# Patient Record
Sex: Male | Born: 1990 | Hispanic: No | Marital: Single | Smoking: Current every day smoker
Health system: Southern US, Community
[De-identification: ages and names within clinical notes are randomized; demographics above are authoritative.]

## PROBLEM LIST (undated history)

## (undated) DIAGNOSIS — L309 Dermatitis, unspecified: Secondary | ICD-10-CM

## (undated) DIAGNOSIS — F1910 Other psychoactive substance abuse, uncomplicated: Secondary | ICD-10-CM

## (undated) HISTORY — DX: Dermatitis, unspecified: L30.9

## (undated) HISTORY — PX: NO PAST SURGERIES: SHX2092

## (undated) HISTORY — DX: Other psychoactive substance abuse, uncomplicated: F19.10

---

## 2014-05-06 ENCOUNTER — Emergency Department (HOSPITAL_COMMUNITY)
Admission: EM | Admit: 2014-05-06 | Discharge: 2014-05-06 | Disposition: A | Discharge status: Home / Self Care | Payer: Self-pay | Attending: Emergency Medicine | Admitting: Emergency Medicine

## 2014-05-06 ENCOUNTER — Encounter (HOSPITAL_COMMUNITY): Payer: Self-pay | Admitting: Emergency Medicine

## 2014-05-06 ENCOUNTER — Emergency Department (HOSPITAL_COMMUNITY): Payer: Self-pay

## 2014-05-06 DIAGNOSIS — F1410 Cocaine abuse, uncomplicated: Secondary | ICD-10-CM | POA: Insufficient documentation

## 2014-05-06 DIAGNOSIS — F1910 Other psychoactive substance abuse, uncomplicated: Secondary | ICD-10-CM

## 2014-05-06 DIAGNOSIS — R259 Unspecified abnormal involuntary movements: Secondary | ICD-10-CM | POA: Insufficient documentation

## 2014-05-06 DIAGNOSIS — F1210 Cannabis abuse, uncomplicated: Secondary | ICD-10-CM | POA: Insufficient documentation

## 2014-05-06 DIAGNOSIS — Z79899 Other long term (current) drug therapy: Secondary | ICD-10-CM | POA: Insufficient documentation

## 2014-05-06 DIAGNOSIS — R209 Unspecified disturbances of skin sensation: Secondary | ICD-10-CM | POA: Insufficient documentation

## 2014-05-06 DIAGNOSIS — Z792 Long term (current) use of antibiotics: Secondary | ICD-10-CM | POA: Insufficient documentation

## 2014-05-06 DIAGNOSIS — F17200 Nicotine dependence, unspecified, uncomplicated: Secondary | ICD-10-CM | POA: Insufficient documentation

## 2014-05-06 LAB — COMPREHENSIVE METABOLIC PANEL
ALBUMIN: 4.9 g/dL (ref 3.5–5.2)
ALK PHOS: 66 U/L (ref 39–117)
ALT: 28 U/L (ref 0–53)
AST: 30 U/L (ref 0–37)
Anion gap: 17 — ABNORMAL HIGH (ref 5–15)
BILIRUBIN TOTAL: 0.5 mg/dL (ref 0.3–1.2)
BUN: 16 mg/dL (ref 6–23)
CHLORIDE: 97 meq/L (ref 96–112)
CO2: 25 mEq/L (ref 19–32)
CREATININE: 1.04 mg/dL (ref 0.50–1.35)
Calcium: 9.5 mg/dL (ref 8.4–10.5)
GFR calc Af Amer: >90 mL/min (ref >90)
Glucose, Bld: 105 mg/dL — ABNORMAL HIGH (ref 70–99)
POTASSIUM: 3.7 meq/L (ref 3.7–5.3)
Sodium: 139 mEq/L (ref 137–147)
Total Protein: 8.2 g/dL (ref 6.0–8.3)

## 2014-05-06 LAB — CBC WITH DIFFERENTIAL/PLATELET
Basophils Absolute: 0 10*3/uL (ref 0.0–0.1)
Basophils Relative: 0 % (ref 0–1)
Eosinophils Absolute: 0.1 10*3/uL (ref 0.0–0.7)
Eosinophils Relative: 1 % (ref 0–5)
HEMATOCRIT: 54.9 % — AB (ref 39.0–52.0)
Hemoglobin: 19.9 g/dL — ABNORMAL HIGH (ref 13.0–17.0)
LYMPHS PCT: 28 % (ref 12–46)
Lymphs Abs: 2.3 10*3/uL (ref 0.7–4.0)
MCH: 30.9 pg (ref 26.0–34.0)
MCHC: 36.2 g/dL — ABNORMAL HIGH (ref 30.0–36.0)
MCV: 85.1 fL (ref 78.0–100.0)
Monocytes Absolute: 0.6 10*3/uL (ref 0.1–1.0)
Monocytes Relative: 7 % (ref 3–12)
NEUTROS ABS: 5.2 10*3/uL (ref 1.7–7.7)
Neutrophils Relative %: 64 % (ref 43–77)
PLATELETS: 200 10*3/uL (ref 150–400)
RBC: 6.45 MIL/uL — AB (ref 4.22–5.81)
WBC: 8.6 10*3/uL (ref 4.0–10.5)

## 2014-05-06 LAB — ETHANOL: ALCOHOL ETHYL (B): 95 mg/dL — AB (ref 0–11)

## 2014-05-06 LAB — RAPID URINE DRUG SCREEN, HOSP PERFORMED
AMPHETAMINES: NOT DETECTED
BARBITURATES: NOT DETECTED
Benzodiazepines: NOT DETECTED
Cocaine: POSITIVE — AB
Opiates: NOT DETECTED
Tetrahydrocannabinol: POSITIVE — AB

## 2014-05-06 LAB — TROPONIN I

## 2014-05-06 LAB — ACETAMINOPHEN LEVEL

## 2014-05-06 MED ORDER — ASPIRIN 81 MG PO CHEW
324.0000 mg | CHEWABLE_TABLET | Freq: Once | ORAL | Status: AC
  Administered 2014-05-06: 324 mg via ORAL
  Filled 2014-05-06: qty 4

## 2014-05-06 MED ORDER — SODIUM CHLORIDE 0.9 % IV BOLUS (SEPSIS)
1000.0000 mL | Freq: Once | INTRAVENOUS | Status: AC
  Administered 2014-05-06: 1000 mL via INTRAVENOUS

## 2014-05-06 MED ORDER — LORAZEPAM 2 MG/ML IJ SOLN
2.0000 mg | Freq: Once | INTRAMUSCULAR | Status: AC
  Administered 2014-05-06: 2 mg via INTRAVENOUS
  Filled 2014-05-06: qty 1

## 2014-05-06 NOTE — ED Notes (Signed)
Pt states that tonight he had a "couple bumps" of cocaine, 4-5 oxycodones and beer and liquor tonight. Pt states that he came in because he started panicking after mixing all of those substances. Shaking in room. HR 91

## 2014-05-06 NOTE — ED Notes (Signed)
Pt sts the 4-5 oxycodone were snorted and that he is not 100% sure that it was oxycodone, that was just what he was told.

## 2014-05-06 NOTE — ED Notes (Signed)
Poison control notified and recommends that he mainly be observed. PC sts that if he had taken 4-5 oxycodone that he should be more sedated at this point. If pt. Becomes sedated, PC recommends Narcan. PC sts that cocaine could cause HTN, Tachycardia, and the shaking that the pt. Is experiencing.

## 2014-05-06 NOTE — ED Provider Notes (Addendum)
CSN: [id number]     Arrival date & time 05/06/14  0003 History   First MD Initiated Contact with Patient 05/06/14 0030     Chief Complaint  Patient presents with  . Ingestion     (Consider location/radiation/quality/duration/timing/severity/associated sxs/prior Treatment) HPI Comments: Patient is a 23 year old male who presents with alcohol and drug intoxication. Reports drinking 2 "very large [place] drinks", snorting a few "bumps" of cocaine and snorting  4-5 pills which he believes to be oxycodone. Began sweating, shaking, having palpitations and started to panic. Currently experiencing right sided facial numbness and uncontrollable shaking of bilateral legs, right worse than left. Has never had reaction to drugs or alcohol like this before, but has experienced panic attacks.  Typically drinks 4-5 beers daily and uses Adderall, cocaine and marijuana weekly. Snorting, what he thinks was percocet, was new for him. Does not feel he has an alcohol or drinking problem. No known hx of mental illness, denies SI. Denies headache, SOB, chest pain or numbness of extremities.  Patient is a 23 y.o. male presenting with Ingested Medication. The history is provided by the patient.  Ingestion Pertinent negatives include no chest pain, no abdominal pain, no headaches and no shortness of breath.    History reviewed. No pertinent past medical history. History reviewed. No pertinent past surgical history. No family history on file. History  Substance Use Topics  . Smoking status: Current Every Day Smoker -- 1.00 packs/day  . Smokeless tobacco: Not on file  . Alcohol Use: Yes    Review of Systems  Constitutional: Positive for activity change. Negative for appetite change.  Respiratory: Negative for cough and shortness of breath.   Cardiovascular: Negative for chest pain.  Gastrointestinal: Negative for abdominal pain.  Genitourinary: Negative for dysuria.  Neurological: Positive for tremors and  numbness. Negative for headaches.      Allergies  Review of patient's allergies indicates no known allergies.  Home Medications   Prior to Admission medications   Medication Sig Start Date End Date Taking? Authorizing Provider  Amoxicillin (AMOXIL PO) Take 1 capsule by mouth daily.   Yes Historical Provider, MD  amphetamine-dextroamphetamine (ADDERALL) 20 MG tablet Take 40 mg by mouth once.   Yes Historical Provider, MD  ibuprofen (ADVIL,MOTRIN) 200 MG tablet Take 800 mg by mouth every 6 (six) hours as needed for moderate pain.   Yes Historical Provider, MD  OXYCODONE HCL PO Take 4 tablets by mouth once.   Yes Historical Provider, MD   BP 148/76  Pulse 99  Temp(Src) 98.1 F (36.7 C) (Oral)  Resp 18  SpO2 98% Physical Exam  Nursing note and vitals reviewed. Constitutional: He is oriented to person, place, and time. He appears well-developed.  HENT:  Head: Normocephalic and atraumatic.  Eyes: Conjunctivae and EOM are normal. Pupils are equal, round, and reactive to light.  Neck: Normal range of motion. Neck supple.  Cardiovascular: Regular rhythm.   No murmur heard. Pulmonary/Chest: Effort normal and breath sounds normal.  Abdominal: Soft. Bowel sounds are normal. He exhibits no distension. There is no tenderness. There is no rebound and no guarding.  Neurological: He is alert and oriented to person, place, and time. Coordination normal.  Right sided facial numbness. Motor and sensory exam for the upper extremity is normal.  Skin: Skin is warm.    ED Course  Procedures (including critical care time) Labs Review Labs Reviewed  CBC WITH DIFFERENTIAL - Abnormal; Notable for the following:    RBC 6.45 (*)  Hemoglobin 19.9 (*)    HCT 54.9 (*)    MCHC 36.2 (*)    All other components within normal limits  COMPREHENSIVE METABOLIC PANEL - Abnormal; Notable for the following:    Glucose, Bld 105 (*)    Anion gap 17 (*)    All other components within normal limits  URINE  RAPID DRUG SCREEN (HOSP PERFORMED) - Abnormal; Notable for the following:    Cocaine POSITIVE (*)    Tetrahydrocannabinol POSITIVE (*)    All other components within normal limits  ETHANOL - Abnormal; Notable for the following:    Alcohol, Ethyl (B) 95 (*)    All other components within normal limits  TROPONIN I  ACETAMINOPHEN LEVEL    Imaging Review Ct Head Wo Contrast  05/06/2014   CLINICAL DATA:  Right facial numbness.  EXAM: CT HEAD WITHOUT CONTRAST  TECHNIQUE: Contiguous axial images were obtained from the base of the skull through the vertex without intravenous contrast.  COMPARISON:  None.  FINDINGS: Skull and Sinuses:Negative for fracture or destructive process. The mastoids, middle ears, and imaged paranasal sinuses are clear.  Orbits: No acute abnormality.  Brain: No evidence of acute abnormality, such as acute infarction, hemorrhage, hydrocephalus, or mass lesion/mass effect.  IMPRESSION: Negative head CT.   Electronically Signed   By: Tiburcio PeaJonathan  Watts M.D.   On: 05/06/2014 01:52     EKG Interpretation   Date/Time:  Thursday May 06 2014 00:20:03 EDT Ventricular Rate:  87 PR Interval:  145 QRS Duration: 91 QT Interval:  346 QTC Calculation: 416 R Axis:   92 Text Interpretation:  Right and left arm electrode reversal,  interpretation assumes no reversal Sinus rhythm Borderline right axis  deviation Confirmed by Courtney Bellizzi, MD, Janey GentaANKIT [phone] on 05/06/2014 12:30:45 AM      MDM   Final diagnoses:  Polysubstance abuse  Cocaine abuse    Pt comes in with cc of substance abuse and resultant numbness to face, palpitations, anxiety.  Pt has never mixed percocet and cocaine - and seems like he snorted both of them today.  He has right sided facial numbness. Rest of the exam is normal. CT head ordered - and is neg for any hemorrhage.  Pt given ativan - and his shaking stopped, HR improved to 90s. Pt on reassessment at 2:45 states that the numbness has resolved as well, and his  sensation is completely normal.  Stable for discharge.  Pt has no desire to get rehab information. He is going to try and quit on his own.  The patient was counseled on the dangers of cocaine, alcohol and narcotic use, and was advised to quit.  Reviewed strategies to maximize success, including support of family/friends. Discussion 3-5 minutes.   Derwood KaplanAnkit Breya Cass, MD 05/06/14 [id number]  Derwood KaplanAnkit Aman Batley, MD 05/06/14 [id number]

## 2014-05-06 NOTE — Discharge Instructions (Signed)
Chemical Dependency Chemical dependency is an addiction to drugs or alcohol. It is characterized by the repeated behavior of seeking out and using drugs and alcohol despite harmful consequences to the health and safety of ones self and others.  RISK FACTORS There are certain situations or behaviors that increase a person's risk for chemical dependency. These include:  A family history of chemical dependency.  A history of mental health issues, including depression and anxiety.  A home environment where drugs and alcohol are easily available to you.  Drug or alcohol use at a young age. SYMPTOMS  The following symptoms can indicate chemical dependency:  Inability to limit the use of drugs or alcohol.  Nausea, sweating, shakiness, and anxiety that occurs when alcohol or drugs are not being used.  An increase in amount of drugs or alcohol that is necessary to get drunk or high. People who experience these symptoms can assess their use of drugs and alcohol by asking themselves the following questions:  Have you been told by friends or family that they are worried about your use of alcohol or drugs?  Do friends and family ever tell you about things you did while drinking alcohol or using drugs that you do not remember?  Do you lie about using alcohol or drugs or about the amounts you use?  Do you have difficulty completing daily tasks unless you use alcohol or drugs?  Is the level of your work or school performance lower because of your drug or alcohol use?  Do you get sick from using drugs or alcohol but keep using anyway?  Do you feel uncomfortable in social situations unless you use alcohol or drugs?  Do you use drugs or alcohol to help forget problems? An answer of yes to any of these questions may indicate chemical dependency. Professional evaluation is suggested. Document Released: 10/16/2001 Document Revised: 01/14/2012 Document Reviewed: 12/28/2010 [facility] Patient  Information 2015 [place], [place]. This information is not intended to replace advice given to you by your health care provider. Make sure you discuss any questions you have with your health care provider.  Polysubstance Abuse When people abuse more than one drug or type of drug it is called polysubstance or polydrug abuse. For example, many smokers also drink alcohol. This is one form of polydrug abuse. Polydrug abuse also refers to the use of a drug to counteract an unpleasant effect produced by another drug. It may also be used to help with withdrawal from another drug. People who take stimulants may become agitated. Sometimes this agitation is countered with a tranquilizer. This helps protect against the unpleasant side effects. Polydrug abuse also refers to the use of different drugs at the same time.  Anytime drug use is interfering with normal living activities, it has become abuse. This includes problems with family and friends. Psychological dependence has developed when your mind tells you that the drug is needed. This is usually followed by physical dependence which has developed when continuing increases of drug are required to get the same feeling or "high". This is known as addiction or chemical dependency. A person's risk is much higher if there is a history of chemical dependency in the family. SIGNS OF CHEMICAL DEPENDENCY  You have been told by friends or family that drugs have become a problem.  You fight when using drugs.  You are having blackouts (not remembering what you do while using).  You feel sick from using drugs but continue using.  You lie about use or  amounts of drugs (chemicals) used.  You need chemicals to get you going.  You are suffering in work performance or in school because of drug use.  You get sick from use of drugs but continue to use anyway.  You need drugs to relate to people or feel comfortable in social situations.  You use drugs to forget  problems. "Yes" answered to any of the above signs of chemical dependency indicates there are problems. The longer the use of drugs continues, the greater the problems will become. If there is a family history of drug or alcohol use, it is best not to experiment with these drugs. Continual use leads to tolerance. After tolerance develops more of the drug is needed to get the same feeling. This is followed by addiction. With addiction, drugs become the most important part of life. It becomes more important to take drugs than participate in the other usual activities of life. This includes relating to friends and family. Addiction is followed by dependency. Dependency is a condition where drugs are now needed not just to get high, but to feel normal. Addiction cannot be cured but it can be stopped. This often requires outside help and the care of professionals. Treatment centers are listed in the yellow pages under: Cocaine, Narcotics, and Alcoholics Anonymous. Most hospitals and clinics can refer you to a specialized care center. Talk to your caregiver if you need help. Document Released: 06/13/2005 Document Revised: 01/14/2012 Document Reviewed: 10/22/2005 [facility] Patient Information 2015 [place], [place]. This information is not intended to replace advice given to you by your health care provider. Make sure you discuss any questions you have with your health care provider.  Cocaine Abuse and Chemical Dependency WHEN IS DRUG USE A PROBLEM? Anytime drug use is interfering with normal living activities it has become abuse. This includes problems with family and friends. Psychological dependence has developed when your mind tells you that the drug is needed. This is usually followed by physical dependence which has developed when continuing increases of drug are required to get the same feeling or "high". This is known as addiction or chemical dependency. A person's risk is much higher if there is a history of  chemical dependency in the family. SIGNS OF CHEMICAL DEPENDENCY:  Been told by friends or family that drugs have become a problem.  Fighting when using drugs.  Having blackouts (not remembering what you do while using).  Feel sick from using drugs but continue using.  Lie about use or amounts of drugs (chemicals) used.  Need chemicals to get you going.  Suffer in work International aid/development worker or school because of drug use.  Get sick from use of drugs but continue to use anyway.  Need drugs to relate to people or feel comfortable in social situations.  Use drugs to forget problems. Yes answered to any of the above signs of chemical dependency indicates there are problems. The longer the use of drugs continues, the greater the problems will become. If there is a family history of drug or alcohol use it is best not to experiment with these drugs. Experimentation leads to tolerance and needing to use more of the drug to get the same feeling. This is followed by addiction where drugs become the most important part of life. It becomes more important to take drugs than participate in the other usual activities of life including relating to friends and family. Addiction is followed by dependency where drugs are now needed not just to get high but  to feel normal. Addiction cannot be cured but it can be stopped. This often requires outside help and the care of professionals. Treatment centers are listed in the yellow pages under: Cocaine, Narcotics, and Alcoholics Anonymous. Most hospitals and clinics can refer you to a specialized care center. WHAT IS COCAINE? Cocaine is a strong nervous system stimulant which speeds up the body and gives the user the feeling that they have increased energy, loss of appetite and feelings of great pleasure. This "high" which begins within several minutes and lasts for less than an hour is followed by a "crash". The crash and depressed feelings that come with it cause a craving  for the drug to regain the high. HOW IS COCANINE USED? Cocaine is snorted, injected, and smoked as free- base or crack. Because smoking the drug produces a greater high it is also associated with a greater low. It is therefore more rapidly addicting. WHAT ARE THE EFFECTS OF COCAINE? It is an anesthetic (pain killer) and a stimulant (it causes a high which gives a false feeling of well being). It increases heart and breathing rates with increases in body temperature and blood pressure. It removes appetite. It causes seizures (convulsions) along with nausea (feeling sick to your stomach), vomiting and stomach pain. This dangerous combination can lead to death. Trying to keep the high feeling leads to greater and greater drug use and this leads to addiction. Addiction can only be helped by stopping use of all chemicals. This is hard but may save your life. If the addiction is continued, the only possible outcome is loss of self respect and self esteem, violence, death, and eventually prison if the addict is fortunate enough to be caught and able to receive help prior to this last life ending event. OTHER HEALTH RISKS OF COCAINE AND ALL DRUG USE ARE:  The increased possibility of getting AIDS or hepatitis (liver inflammation).  Having a baby born which is addicted to cocaine and must go through painful withdrawal including shaking, jerking, and crying in pain. Many of the babies die. Other babies go through life with lifelong disabilities and learning problems. HOW TO STAY DRUG FREE ONCE YOU HAVE QUIT USING:  Develop healthy activities and form friends who do not use drugs.  Stay away from the drug scene.  Tell the pusher or former friend you have other better things to do.  Have ready excuses available about why you cannot use. For more help or information contact your local physician, clinic, hospital or dial 1-800-cocaine 347-809-4272(1-930-256-8592). Document Released: 10/19/2000 Document Revised:  01/14/2012 Document Reviewed: 06/09/2008 [facility] Patient Information 2015 [place], [place]. This information is not intended to replace advice given to you by your health care provider. Make sure you discuss any questions you have with your health care provider.

## 2018-03-05 ENCOUNTER — Emergency Department (HOSPITAL_COMMUNITY)
Admission: EM | Admit: 2018-03-05 | Discharge: 2018-03-05 | Disposition: A | Discharge status: Home / Self Care | Payer: Self-pay | Attending: Emergency Medicine | Admitting: Emergency Medicine

## 2018-03-05 ENCOUNTER — Other Ambulatory Visit: Payer: Self-pay

## 2018-03-05 ENCOUNTER — Emergency Department (HOSPITAL_COMMUNITY): Payer: Self-pay

## 2018-03-05 ENCOUNTER — Encounter (HOSPITAL_COMMUNITY): Payer: Self-pay

## 2018-03-05 DIAGNOSIS — N644 Mastodynia: Secondary | ICD-10-CM | POA: Insufficient documentation

## 2018-03-05 DIAGNOSIS — F17210 Nicotine dependence, cigarettes, uncomplicated: Secondary | ICD-10-CM | POA: Insufficient documentation

## 2018-03-05 MED ORDER — ACETAMINOPHEN 325 MG PO TABS: 650.0000 mg | ORAL_TABLET | Freq: Four times a day (QID) | ORAL | 0 refills | Status: DC | PRN

## 2018-03-05 NOTE — ED Notes (Signed)
Family at bedside reports pt has lost 40 lbs over past 3-4 months. Pt reports he noticed the swelling to his left nipple after hitting it on wall.

## 2018-03-05 NOTE — Discharge Instructions (Signed)
You may use Tylenol and ibuprofen if you have symptoms of pain.  You may also use compresses for your symptoms.  You were given a referral to the breast clinic.  You should follow-up with the office for further evaluation.  You were also given information for the [facility] health and wellness clinic and you should call the office to schedule an appointment for follow-up.  Please return to the emergency department for any new or worsening symptoms.

## 2018-03-05 NOTE — ED Notes (Signed)
ED Provider at bedside. 

## 2018-03-05 NOTE — ED Triage Notes (Addendum)
Pt endorses lump under left nipple and thinks it's an abscess. However, pt is concerned because pt's brother had cancer and pt states that he has lost 40 lbs in the last 3 months without really trying. VSS. NAD

## 2018-03-05 NOTE — ED Provider Notes (Signed)
[facility] EMERGENCY DEPARTMENT Provider Note   CSN: [id number] Arrival date & time: 03/05/18  1159     History   Chief Complaint Chief Complaint  Patient presents with  . Abscess    HPI [name] is a 27 y.o. male.  HPI   Patient is a 27 year old male with no significant past medical history who presents the ED today to be evaluated for left breast tenderness and swelling beneath the left nipple that he noted about 1 week ago.  States that he noticed this after he accidentally ran into a wall and hit the left breast on the wall and noticed that he had pain there.  On palpation of the area noticed that he had some swelling and firmer tissue underneath the left nipple that is not present on the right.  Denies any pain without palpation of the area.  He states that he has had a 40 pound weight loss over the past 4 months.  States that he used to go out to eat every single day of the week however he try to change his diet and stop going out to eat so much over the last several months.  Denies any other changes in diet or exercise.  No surrounding redness.  He denies any other systemic symptoms including no discharge from the nipple, fevers, night sweats, chest pain, shortness of breath, abdominal pain, nausea, vomiting, diarrhea, urinary symptoms, back pain, numbness or weakness to his arms or legs.  Does report a family history of breast cancer. sxs have been constant since onset. Denies other alleviating or aggravating factors.  History reviewed. No pertinent past medical history.  There are no active problems to display for this patient.   History reviewed. No pertinent surgical history.      Home Medications    Prior to Admission medications   Medication Sig Start Date End Date Taking? Authorizing Provider  acetaminophen (TYLENOL) 325 MG tablet Take 2 tablets (650 mg total) by mouth every 6 (six) hours as needed. Do not take more than  of tylenol  per day 03/05/18   Josiephine Simao S, PA-C  Amoxicillin (AMOXIL PO) Take 1 capsule by mouth daily.    [provider]  amphetamine-dextroamphetamine (ADDERALL) 20 MG tablet Take 40 mg by mouth once.    [provider]  ibuprofen (ADVIL,MOTRIN) 200 MG tablet Take 800 mg by mouth every 6 (six) hours as needed for moderate pain.    [provider]  OXYCODONE HCL PO Take 4 tablets by mouth once.    [provider]    Family History History reviewed. No pertinent family history.  Social History Social History   Tobacco Use  . Smoking status: Current Every Day Smoker    Packs/day: 1.00    Types: Cigarettes  Substance Use Topics  . Alcohol use: Yes    Comment: occ  . Drug use: Yes    Types: Cocaine, Marijuana    Comment: opiates     Allergies   Patient has no known allergies.   Review of Systems Review of Systems  Constitutional: Positive for unexpected weight change. Negative for appetite change, chills, fatigue and fever.  HENT: Negative for ear pain and sore throat.   Eyes: Negative for pain and visual disturbance.  Respiratory: Negative for cough and shortness of breath.   Cardiovascular: Negative for chest pain and palpitations.       Left breast pain, firmer skin  Gastrointestinal: Negative for abdominal pain, constipation,  diarrhea, nausea and vomiting.  Genitourinary: Negative for dysuria and hematuria.  Musculoskeletal: Negative for arthralgias and back pain.  Skin: Negative for color change and rash.  Neurological: Negative for dizziness, seizures, syncope, weakness, light-headedness, numbness and headaches.  All other systems reviewed and are negative.    Physical Exam Updated Vital Signs BP (!) 143/94 (BP Location: Right Arm)   Pulse 94   Temp 98 F (36.7 C) (Oral)   Resp 16   Ht 6' (1.829 m)   Wt 71.7 kg (158 lb)   SpO2 99%   BMI 21.43 kg/m   Physical Exam  Constitutional: He appears well-developed and  well-nourished.  HENT:  Head: Normocephalic and atraumatic.  Eyes: Conjunctivae are normal.  Neck: Neck supple.  Cardiovascular: Normal rate, regular rhythm and normal heart sounds.  No murmur heard. Pulmonary/Chest: Effort normal and breath sounds normal. No respiratory distress. He has no wheezes.  ttp to left breast tissue underneath the nipple, skin beneath nipple on the left side is indurated, area is mobile, no fluctuance noted, no nipple discharge, no surrounding erythema  Abdominal: Soft. Bowel sounds are normal. He exhibits no distension. There is no tenderness. There is no guarding.  Musculoskeletal: He exhibits no edema.  Neurological: He is alert.  Skin: Skin is warm and dry. Capillary refill takes less than 2 seconds.  Psychiatric: He has a normal mood and affect.  Nursing note and vitals reviewed.    ED Treatments / Results  Labs (all labs ordered are listed, but only abnormal results are displayed) Labs Reviewed - No data to display  EKG None  Radiology Dg Chest 2 View  Result Date: 03/05/2018 CLINICAL DATA:  Patient has a history of chest injury and has a palpable knot under the left nipple. EXAM: CHEST - 2 VIEW COMPARISON:  None in PACs FINDINGS: The lungs are mildly hyperinflated and clear. The heart and pulmonary vascularity are normal. The mediastinum is normal in width. There is no pleural effusion. On the lateral view the pectoral soft tissues are grossly normal. The palpable nodule was not marked with a BB. IMPRESSION: Mild hyperinflation may be voluntary or may reflect air trapping and the patient's smoking history. No acute cardiopulmonary abnormality. No definite abnormal chest wall mass is observed. Further evaluation with ultrasound may be useful if the palpable nodule persists. Electronically Signed   By: David  [place] M.D.   On: 03/05/2018 12:40    Procedures Procedures (including critical care time)  Medications Ordered in ED Medications - No data to  display   Initial Impression / Assessment and Plan / ED Course  I have reviewed the triage vital signs and the nursing notes.  Pertinent labs & imaging results that were available during my care of the patient were reviewed by me and considered in my medical decision making (see chart for details).    Bedside US completed by Burna Forts, PA-C did not show evidence of fluid collection.   Final Clinical Impressions(s) / ED Diagnoses   Final diagnoses:  Breast pain, left   Patient is a 27 year old male presenting to be evaluated for left breast pain and palpable mass noted by patient.  His vital signs are stable today in the ED.  He is afebrile.  He has had some weight loss recently which could be attributed to recent significant change in his diet.  He has no other systemic symptoms.  There is no surrounding erythema to suggest cellulitis.  No evidence of abscess or fluid collection on  bedside ultrasound.  Give patient referral to breast clinic as well as information to follow-up with PCP.  Advised to return to the ER for any new or worsening symptoms in the meantime.  All questions answered and patient and mother at bedside understand the plan and reasons to return immediately to ED.    ED Discharge Orders        Ordered    acetaminophen (TYLENOL) 325 MG tablet  Every 6 hours PRN     03/05/18 1403       Elenor Wildes S, PA-C 03/05/18 1405    Little, Ambrose Finland, MD 03/05/18 1550

## 2018-03-16 ENCOUNTER — Emergency Department (HOSPITAL_COMMUNITY)
Admission: EM | Admit: 2018-03-16 | Discharge: 2018-03-16 | Disposition: A | Discharge status: Home / Self Care | Payer: Self-pay | Attending: Emergency Medicine | Admitting: Emergency Medicine

## 2018-03-16 ENCOUNTER — Encounter (HOSPITAL_COMMUNITY): Payer: Self-pay | Admitting: Emergency Medicine

## 2018-03-16 ENCOUNTER — Emergency Department (HOSPITAL_COMMUNITY): Payer: Self-pay

## 2018-03-16 ENCOUNTER — Other Ambulatory Visit: Payer: Self-pay

## 2018-03-16 DIAGNOSIS — Y999 Unspecified external cause status: Secondary | ICD-10-CM | POA: Insufficient documentation

## 2018-03-16 DIAGNOSIS — Y939 Activity, unspecified: Secondary | ICD-10-CM | POA: Insufficient documentation

## 2018-03-16 DIAGNOSIS — W11XXXA Fall on and from ladder, initial encounter: Secondary | ICD-10-CM | POA: Insufficient documentation

## 2018-03-16 DIAGNOSIS — F17210 Nicotine dependence, cigarettes, uncomplicated: Secondary | ICD-10-CM | POA: Insufficient documentation

## 2018-03-16 DIAGNOSIS — Z79899 Other long term (current) drug therapy: Secondary | ICD-10-CM | POA: Insufficient documentation

## 2018-03-16 DIAGNOSIS — S52291A Other fracture of shaft of right ulna, initial encounter for closed fracture: Secondary | ICD-10-CM | POA: Insufficient documentation

## 2018-03-16 DIAGNOSIS — Y929 Unspecified place or not applicable: Secondary | ICD-10-CM | POA: Insufficient documentation

## 2018-03-16 MED ORDER — HYDROCODONE-ACETAMINOPHEN 5-325 MG PO TABS: 1.0000 | ORAL_TABLET | Freq: Four times a day (QID) | ORAL | 0 refills | Status: DC | PRN

## 2018-03-16 MED ORDER — HYDROCODONE-ACETAMINOPHEN 5-325 MG PO TABS
1.0000 | ORAL_TABLET | Freq: Once | ORAL | Status: AC
  Administered 2018-03-16: 1 via ORAL
  Filled 2018-03-16: qty 1

## 2018-03-16 NOTE — Discharge Instructions (Signed)
You can take 1000 mg of Tylenol.  Do not exceed 4000 mg of Tylenol a day.  Take pain medication for severe breakthrough pain.  As we discussed, keep the splint on at all times.  Do not get it wet.  He can elevate the arm while you are at home to help with swelling.  Follow-up with referred orthopedic doctor as directed.  Call their office and arrange for an appointment sometime this week.  Return to the emergency department for any worsening pain, discoloration of the fingers, numbness/weakness of the fingers, fever or any other worsening or concerning symptoms.

## 2018-03-16 NOTE — ED Provider Notes (Signed)
[facility] EMERGENCY DEPARTMENT Provider Note   CSN: [id number] Arrival date & time: 03/16/18  1129     History   Chief Complaint Chief Complaint  Patient presents with  . Arm Pain    HPI DELMAR ARRIAGA is a 27 y.o. male who presents for evaluation of right arm pain that began yesterday.  Patient reports that he was working on a ladder and states that he fell off a ladder and landed on his right arm.  He states his arm was not outstretched.  Patient reports that he did not lose any consciousness.  Patient reports that he fell from only 3 steps off a ladder.  Patient reports he remembers the entire event was able to ambulate afterwards.  Patient reports that afterwards, he started having worsening pain to the right forearm.  Patient reports he has not taken anything for the pain.  Patient reports that he woke up today with worsening pain, swelling to the area, prompting ED visit.  Patient reports initially this morning, he moved.  Denies any numbness/weakness.  Patient denies any vomiting, vision changes, shoulder pain, elbow pain, wrist pain, CP, SOB.  The history is provided by the patient.    History reviewed. No pertinent past medical history.  There are no active problems to display for this patient.   History reviewed. No pertinent surgical history.      Home Medications    Prior to Admission medications   Medication Sig Start Date End Date Taking? Authorizing Provider  acetaminophen (TYLENOL) 325 MG tablet Take 2 tablets (650 mg total) by mouth every 6 (six) hours as needed. Do not take more than  of tylenol per day 03/05/18   Couture, Cortni S, PA-C  Amoxicillin (AMOXIL PO) Take 1 capsule by mouth daily.    [provider]  amphetamine-dextroamphetamine (ADDERALL) 20 MG tablet Take 40 mg by mouth once.    [provider]  HYDROcodone-acetaminophen (NORCO/VICODIN) 5-325 MG tablet Take 1-2 tablets by mouth every 6 (six) hours as  needed. 03/16/18   Maxwell Caul, PA-C  ibuprofen (ADVIL,MOTRIN) 200 MG tablet Take 800 mg by mouth every 6 (six) hours as needed for moderate pain.    [provider]  OXYCODONE HCL PO Take 4 tablets by mouth once.    [provider]    Family History No family history on file.  Social History Social History   Tobacco Use  . Smoking status: Current Every Day Smoker    Packs/day: 1.00    Types: Cigarettes  Substance Use Topics  . Alcohol use: Yes    Comment: occ  . Drug use: Yes    Types: Cocaine, Marijuana    Comment: opiates     Allergies   Patient has no known allergies.   Review of Systems Review of Systems  Eyes: Negative for visual disturbance.  Gastrointestinal: Negative for vomiting.  Musculoskeletal:       Right forearm pain  Neurological: Negative for weakness and numbness.     Physical Exam Updated Vital Signs BP 123/82 (BP Location: Left Arm)   Pulse 72   Temp 98 F (36.7 C) (Oral)   Resp 16   SpO2 97%   Physical Exam  Constitutional: He appears well-developed and well-nourished.  HENT:  Head: Normocephalic and atraumatic.  Eyes: Conjunctivae and EOM are normal. Right eye exhibits no discharge. Left eye exhibits no discharge. No scleral icterus.  Neck:  Full flexion/extension and lateral movement of neck fully intact.  No bony midline tenderness. No deformities or crepitus.   Cardiovascular:  Pulses:      Radial pulses are 2+ on the right side, and 2+ on the left side.  Pulmonary/Chest: Effort normal.  Musculoskeletal:       Arms: No tenderness palpation noted to right wrist.  Flexion/extension of right wrist intact without any difficulty.  No anatomical snuffbox tenderness.  Full range of motion of all 5 digits of right hand without difficulty.  No tenderness palpation noted to right elbow.  No deformity or crepitus noted.  Full range of motion of right elbow intact without any difficulty.  No tenderness palpation noted to  right shoulder.  No deformity or crepitus.  No abnormalities of the left upper extremity.  Bilateral clavicles appear symmetric in appearance.  Neurological: He is alert.  Sensation intact along major nerve distributions of BUE  Skin: Skin is warm and dry. Capillary refill takes less than 2 seconds.  Good distal cap refill. RUE is not dusky in appearance or cool to touch.  Psychiatric: He has a normal mood and affect. His speech is normal and behavior is normal.  Nursing note and vitals reviewed.    ED Treatments / Results  Labs (all labs ordered are listed, but only abnormal results are displayed) Labs Reviewed - No data to display  EKG None  Radiology Dg Forearm Right  Result Date: 03/16/2018 CLINICAL DATA:  Larey Seat from a ladder today with arm pain. EXAM: RIGHT FOREARM - 2 VIEW COMPARISON:  None. FINDINGS: Nondisplaced linear incomplete fracture of the distal ulnar diaphysis. No abnormality of the radius. Elbow and wrist appear normal as seen. IMPRESSION: Nondisplaced linear incomplete fracture of the distal ulnar diaphysis. Electronically Signed   By: Paulina Fusi M.D.   On: 03/16/2018 11:56    Procedures Procedures (including critical care time)  Medications Ordered in ED Medications  HYDROcodone-acetaminophen (NORCO/VICODIN) 5-325 MG per tablet 1 tablet (1 tablet Oral Given 03/16/18 1334)     Initial Impression / Assessment and Plan / ED Course  I have reviewed the triage vital signs and the nursing notes.  Pertinent labs & imaging results that were available during my care of the patient were reviewed by me and considered in my medical decision making (see chart for details).     27 year old male who presents for evaluation of right forearm pain after mechanical fall that occurred yesterday. Patient is afebrile, non-toxic appearing, sitting comfortably on examination table. Vital signs reviewed and stable. Patient is neurovascularly intact.  On exam, patient is tender to  the anterior aspect of the right forearm.  With some overlying soft tissue swelling.  No deformity or crepitus noted.  Consider sprain versus fracture versus contusion.  X-rays ordered at triage.  Analgesics provided in the department. Given reassuring exam and NEXUS criteria, no indication for cervical imaging at this time.  Given reassuring physical exam and per [facility] CT criteria, no imaging is indicated at this time.   X-rays reviewed.  There is a small displaced linear incomplete fracture of the distal ulnar diaphysis.  Discussed results with patient and mom.  We will plan to splint in the ED.  Reevaluation after splint placement.  Patient has good distal cap refill and can move all extremities.  Patient follow-up with orthopedics as directed.  Patient had ample opportunity for questions and discussion. All patient's questions were answered with full understanding. Strict return precautions discussed. Patient expresses understanding and agreement to plan.    Final Clinical Impressions(s) /  ED Diagnoses   Final diagnoses:  Other closed fracture of shaft of right ulna, initial encounter    ED Discharge Orders        Ordered    HYDROcodone-acetaminophen (NORCO/VICODIN) 5-325 MG tablet  Every 6 hours PRN     03/16/18 1407       Rosana Hoes 03/16/18 1522    Jacalyn Lefevre, MD 03/16/18 1605

## 2018-03-16 NOTE — ED Triage Notes (Addendum)
Patient fell off of a ladder yesterday and braced his fall with his arms against the ground, reports hearing a "pop" in his right forearm and has had increasing pain ever since. PNS intact distal to injury.  Denies LOC.

## 2018-03-16 NOTE — Progress Notes (Signed)
Orthopedic Tech Progress Note Patient Details:  Antonio Mcfarland 02/10/1991 [id number]  Ortho Devices Type of Ortho Device: Arm sling, Sugartong splint Ortho Device/Splint Location: rue Ortho Device/Splint Interventions: Application   Post Interventions Patient Tolerated: Well Instructions Provided: Care of device   [name] 03/16/2018, 2:00 PM

## 2018-03-16 NOTE — ED Notes (Signed)
Patient transported to X-ray 

## 2018-09-02 ENCOUNTER — Encounter (HOSPITAL_COMMUNITY): Payer: Self-pay | Admitting: *Deleted

## 2018-09-02 ENCOUNTER — Emergency Department (HOSPITAL_COMMUNITY)
Admission: EM | Admit: 2018-09-02 | Discharge: 2018-09-02 | Disposition: A | Discharge status: Home / Self Care | Payer: Self-pay | Attending: Emergency Medicine | Admitting: Emergency Medicine

## 2018-09-02 ENCOUNTER — Other Ambulatory Visit: Payer: Self-pay

## 2018-09-02 DIAGNOSIS — S61214A Laceration without foreign body of right ring finger without damage to nail, initial encounter: Secondary | ICD-10-CM | POA: Insufficient documentation

## 2018-09-02 DIAGNOSIS — Y999 Unspecified external cause status: Secondary | ICD-10-CM | POA: Insufficient documentation

## 2018-09-02 DIAGNOSIS — Z79899 Other long term (current) drug therapy: Secondary | ICD-10-CM | POA: Insufficient documentation

## 2018-09-02 DIAGNOSIS — W25XXXA Contact with sharp glass, initial encounter: Secondary | ICD-10-CM | POA: Insufficient documentation

## 2018-09-02 DIAGNOSIS — Y93G1 Activity, food preparation and clean up: Secondary | ICD-10-CM | POA: Insufficient documentation

## 2018-09-02 DIAGNOSIS — F17210 Nicotine dependence, cigarettes, uncomplicated: Secondary | ICD-10-CM | POA: Insufficient documentation

## 2018-09-02 DIAGNOSIS — Z23 Encounter for immunization: Secondary | ICD-10-CM | POA: Insufficient documentation

## 2018-09-02 DIAGNOSIS — Y929 Unspecified place or not applicable: Secondary | ICD-10-CM | POA: Insufficient documentation

## 2018-09-02 DIAGNOSIS — S61216A Laceration without foreign body of right little finger without damage to nail, initial encounter: Secondary | ICD-10-CM

## 2018-09-02 MED ORDER — LIDOCAINE HCL 2 % IJ SOLN
20.0000 mL | Freq: Once | INTRAMUSCULAR | Status: AC
  Administered 2018-09-02: 400 mg
  Filled 2018-09-02: qty 20

## 2018-09-02 MED ORDER — TETANUS-DIPHTH-ACELL PERTUSSIS 5-2.5-18.5 LF-MCG/0.5 IM SUSP
0.5000 mL | Freq: Once | INTRAMUSCULAR | Status: AC
  Administered 2018-09-02: 0.5 mL via INTRAMUSCULAR
  Filled 2018-09-02: qty 0.5

## 2018-09-02 NOTE — Discharge Instructions (Addendum)
REturn here as needed. Follow up to have sutures out in 14 days. Keep area clean and dry.

## 2018-09-02 NOTE — ED Provider Notes (Signed)
[facility]-EMERGENCY DEPT Provider Note   CSN: [id number] Arrival date & time: 09/02/18  8295     History   Chief Complaint Chief Complaint  Patient presents with  . Extremity Laceration    HPI Antonio Mcfarland is a 27 y.o. male. Patient presents to the emergency department with a laceration to the right ring finger.  The patient states that Antonio Mcfarland was washing dishes when Antonio Mcfarland cut his finger on a broken piece of glass.  Patient states that Antonio Mcfarland applied direct pressure to control the bleeding.  The patient states Antonio Mcfarland did not take any medications prior to arrival for symptoms.  Patient denies any numbness or weakness in the finger.  History reviewed. No pertinent past medical history.  There are no active problems to display for this patient.   History reviewed. No pertinent surgical history.      Home Medications    Prior to Admission medications   Medication Sig Start Date End Date Taking? Authorizing Provider  acetaminophen (TYLENOL) 325 MG tablet Take 2 tablets (650 mg total) by mouth every 6 (six) hours as needed. Do not take more than 4000mg  of tylenol per day 03/05/18   Couture, Cortni S, PA-C  Amoxicillin (AMOXIL PO) Take 1 capsule by mouth daily.    [provider]  amphetamine-dextroamphetamine (ADDERALL) 20 MG tablet Take 40 mg by mouth once.    [provider]  HYDROcodone-acetaminophen (NORCO/VICODIN) 5-325 MG tablet Take 1-2 tablets by mouth every 6 (six) hours as needed. 03/16/18   Maxwell Caul, PA-C  ibuprofen (ADVIL,MOTRIN) 200 MG tablet Take 800 mg by mouth every 6 (six) hours as needed for moderate pain.    [provider]  OXYCODONE HCL PO Take 4 tablets by mouth once.    [provider]    Family History No family history on file.  Social History Social History   Tobacco Use  . Smoking status: Current Every Day Smoker    Packs/day: 1.00    Types: Cigarettes  Substance Use Topics  . Alcohol use:  Yes    Comment: occ  . Drug use: Yes    Types: Cocaine, Marijuana    Comment: opiates     Allergies   Patient has no known allergies.   Review of Systems Review of Systems All other systems negative except as documented in the HPI. All pertinent positives and negatives as reviewed in the HPI.  Physical Exam Updated Vital Signs BP 132/81 (BP Location: Right Arm)   Pulse 69   Temp 98.1 F (36.7 C) (Oral)   Resp 16   SpO2 98%   Physical Exam  Constitutional: Antonio Mcfarland is oriented to person, place, and time. Antonio Mcfarland appears well-developed and well-nourished. No distress.  HENT:  Head: Normocephalic and atraumatic.  Eyes: Pupils are equal, round, and reactive to light.  Pulmonary/Chest: Effort normal.  Musculoskeletal:       Right hand: Antonio Mcfarland exhibits laceration. Antonio Mcfarland exhibits normal range of motion and normal capillary refill. Normal sensation noted. Normal strength noted.       Hands: Neurological: Antonio Mcfarland is alert and oriented to person, place, and time.  Skin: Skin is warm and dry.  Psychiatric: Antonio Mcfarland has a normal mood and affect.  Nursing note and vitals reviewed.    ED Treatments / Results  Labs (all labs ordered are listed, but only abnormal results are displayed) Labs Reviewed - No data to display  EKG None  Radiology No results found.  Procedures Procedures (including critical  care time)  Medications Ordered in ED Medications  Tdap (BOOSTRIX) injection 0.5 mL (has no administration in time range)  lidocaine (XYLOCAINE) 2 % (with pres) injection 400 mg (has no administration in time range)     Initial Impression / Assessment and Plan / ED Course  I have reviewed the triage vital signs and the nursing notes.  Pertinent labs & imaging results that were available during my care of the patient were reviewed by me and considered in my medical decision making (see chart for details).     LACERATION REPAIR Performed by: Carlyle Dolly Authorized by: Jamesetta Orleans  Frankie Zito Consent: Verbal consent obtained. Risks and benefits: risks, benefits and alternatives were discussed Consent given by: patient Patient identity confirmed: provided demographic data Prepped and Draped in normal sterile fashion Wound explored  Laceration Location: R ring finger  Laceration Length: 2cm  No Foreign Bodies seen or palpated  Anesthesia: local infiltration  Local anesthetic: lidocaine 2% wo epinephrine  Anesthetic total: 5 ml  Irrigation method: syringe Amount of cleaning: standard  Skin closure: 4-0 prolene  Number of sutures: 6  Technique: simple interrupted  Patient tolerance: Patient tolerated the procedure well with no immediate complications.   Final Clinical Impressions(s) / ED Diagnoses   Final diagnoses:  None    ED Discharge Orders    None      Charlestine Night, PA-C 09/02/18 1605  Linwood Dibbles, MD 09/05/18 [phone]

## 2018-09-02 NOTE — ED Triage Notes (Signed)
Pt says that he was washing dishes and cut his left hand pinky finger on the dish. Bleeding controlled at time of assessment. Unknown tetanus

## 2018-11-16 ENCOUNTER — Other Ambulatory Visit: Payer: Self-pay

## 2018-11-16 ENCOUNTER — Emergency Department (HOSPITAL_COMMUNITY): Payer: Self-pay

## 2018-11-16 ENCOUNTER — Emergency Department (HOSPITAL_COMMUNITY)
Admission: EM | Admit: 2018-11-16 | Discharge: 2018-11-16 | Disposition: A | Discharge status: Home / Self Care | Payer: Self-pay | Attending: Emergency Medicine | Admitting: Emergency Medicine

## 2018-11-16 DIAGNOSIS — S62339A Displaced fracture of neck of unspecified metacarpal bone, initial encounter for closed fracture: Secondary | ICD-10-CM

## 2018-11-16 DIAGNOSIS — S62316A Displaced fracture of base of fifth metacarpal bone, right hand, initial encounter for closed fracture: Secondary | ICD-10-CM | POA: Insufficient documentation

## 2018-11-16 DIAGNOSIS — W101XXA Fall (on)(from) sidewalk curb, initial encounter: Secondary | ICD-10-CM | POA: Insufficient documentation

## 2018-11-16 DIAGNOSIS — Z79899 Other long term (current) drug therapy: Secondary | ICD-10-CM | POA: Insufficient documentation

## 2018-11-16 DIAGNOSIS — S42025A Nondisplaced fracture of shaft of left clavicle, initial encounter for closed fracture: Secondary | ICD-10-CM

## 2018-11-16 DIAGNOSIS — Y9301 Activity, walking, marching and hiking: Secondary | ICD-10-CM | POA: Insufficient documentation

## 2018-11-16 DIAGNOSIS — Y92480 Sidewalk as the place of occurrence of the external cause: Secondary | ICD-10-CM | POA: Insufficient documentation

## 2018-11-16 DIAGNOSIS — Y999 Unspecified external cause status: Secondary | ICD-10-CM | POA: Insufficient documentation

## 2018-11-16 DIAGNOSIS — S42022A Displaced fracture of shaft of left clavicle, initial encounter for closed fracture: Secondary | ICD-10-CM | POA: Insufficient documentation

## 2018-11-16 DIAGNOSIS — F17210 Nicotine dependence, cigarettes, uncomplicated: Secondary | ICD-10-CM | POA: Insufficient documentation

## 2018-11-16 MED ORDER — OXYCODONE-ACETAMINOPHEN 5-325 MG PO TABS
1.0000 | ORAL_TABLET | Freq: Once | ORAL | Status: AC
  Administered 2018-11-16: 1 via ORAL
  Filled 2018-11-16: qty 1

## 2018-11-16 MED ORDER — OXYCODONE-ACETAMINOPHEN 5-325 MG PO TABS: 1.0000 | ORAL_TABLET | ORAL | 0 refills | Status: DC | PRN

## 2018-11-16 NOTE — ED Provider Notes (Signed)
[facility]   CSN: [id number] Arrival date & time: 11/16/18  0300     History   Chief Complaint Chief Complaint  Patient presents with  . Shoulder Pain    HPI Antonio Mcfarland is a 28 y.o. male.  Patient to ED after tripping while walking on a sidewalk. He reports falling forward on outstretched right arm/hand and landing on the left shoulder. He denies hitting his head. No LOC, nausea, neck/chest/abdominal pain. He has been ambulatory and denies LE pain. He admits to drinking tonight but denies other substances. He has full memory of fall and events afterward.   The history is provided by the patient. No language interpreter was used.  Shoulder Pain  Associated symptoms: no neck pain     No past medical history on file.  There are no active problems to display for this patient.   No past surgical history on file.      Home Medications    Prior to Admission medications   Medication Sig Start Date End Date Taking? Authorizing Provider  acetaminophen (TYLENOL) 325 MG tablet Take 2 tablets (650 mg total) by mouth every 6 (six) hours as needed. Do not take more than 4000mg  of tylenol per day 03/05/18   Couture, Cortni S, PA-C  Amoxicillin (AMOXIL PO) Take 1 capsule by mouth daily.    [provider]  amphetamine-dextroamphetamine (ADDERALL) 20 MG tablet Take 40 mg by mouth once.    [provider]  HYDROcodone-acetaminophen (NORCO/VICODIN) 5-325 MG tablet Take 1-2 tablets by mouth every 6 (six) hours as needed. 03/16/18   Maxwell CaulLayden, Lindsey A, PA-C  ibuprofen (ADVIL,MOTRIN) 200 MG tablet Take 800 mg by mouth every 6 (six) hours as needed for moderate pain.    [provider]  OXYCODONE HCL PO Take 4 tablets by mouth once.    [provider]    Family History No family history on file.  Social History Social History   Tobacco Use  . Smoking status: Current Every Day Smoker   Packs/day: 1.00    Types: Cigarettes  Substance Use Topics  . Alcohol use: Yes    Comment: occ  . Drug use: Yes    Types: Cocaine, Marijuana    Comment: opiates     Allergies   Patient has no known allergies.   Review of Systems Review of Systems  Constitutional: Negative for diaphoresis.  HENT: Negative for facial swelling.   Respiratory: Negative for shortness of breath.   Cardiovascular: Negative for chest pain.  Gastrointestinal: Negative for abdominal pain and nausea.  Musculoskeletal: Negative for neck pain.       See HPI.  Skin:       Abrasion to right elbow  Neurological: Negative for syncope and headaches.     Physical Exam Updated Vital Signs BP 132/78   Pulse (!) 109   Temp 97.7 F (36.5 C) (Oral)   Resp 20   Ht 6' (1.829 m)   Wt 74.8 kg   SpO2 99%   BMI 22.38 kg/m   Physical Exam Constitutional:      Appearance: He is well-developed.  Neck:     Musculoskeletal: Normal range of motion.  Pulmonary:     Effort: Pulmonary effort is normal.  Chest:     Chest wall: No tenderness.  Musculoskeletal: Normal range of motion.     Comments: Right hand with bony deformity of distal 5th Metacarpal  Skin:    General: Skin is  warm and dry.  Neurological:     Mental Status: He is alert and oriented to person, place, and time.      ED Treatments / Results  Labs (all labs ordered are listed, but only abnormal results are displayed) Labs Reviewed - No data to display  EKG None  Radiology Dg Clavicle Left  Result Date: 11/16/2018 CLINICAL DATA:  Fall with shoulder pain EXAM: LEFT CLAVICLE - 2+ VIEWS COMPARISON:  None. FINDINGS: Acute nondisplaced fracture involving the mid to distal aspect of left clavicle. AC joint is intact. IMPRESSION: Acute nondisplaced fracture mid to distal left clavicle Electronically Signed   By: Jasmine Pang M.D.   On: 11/16/2018 03:54   Dg Shoulder Left  Result Date: 11/16/2018 CLINICAL DATA:  Fall EXAM: LEFT SHOULDER -  2+ VIEW COMPARISON:  None. FINDINGS: Acute angulated fracture mid to distal shaft left clavicle with displacement and apex superior angulation. IMPRESSION: Acute angulated and displaced fracture mid to distal left clavicle Electronically Signed   By: Jasmine Pang M.D.   On: 11/16/2018 03:56   Dg Hand Complete Right  Result Date: 11/16/2018 CLINICAL DATA:  Fall EXAM: RIGHT HAND - COMPLETE 3+ VIEW COMPARISON:  None. FINDINGS: Acute fracture distal shaft of the fifth metacarpal with mild volar angulation of distal fracture fragment and about 1/4 bone with of volar displacement. Less than 1/4 bone with radial displacement of distal fracture fragment. No subluxation. IMPRESSION: Acute mildly displaced and angulated distal fifth metacarpal fracture Electronically Signed   By: Jasmine Pang M.D.   On: 11/16/2018 03:55    Procedures Procedures (including critical care time)  Medications Ordered in ED Medications  oxyCODONE-acetaminophen (PERCOCET/ROXICET) 5-325 MG per tablet 1 tablet (1 tablet Oral Given 11/16/18 0440)     Initial Impression / Assessment and Plan / ED Course  I have reviewed the triage vital signs and the nursing notes.  Pertinent labs & imaging results that were available during my care of the patient were reviewed by me and considered in my medical decision making (see chart for details).     Patient to ED with injuries after fall onto concrete. He is acutely intoxicated but coherent and rational.   He is found to have a right boxer's fracture and left clavicle fracture. Both are closed injuries. Splint applied to right hand. Pressure applied while splinting in attempt to reduce angulation. The hand is neurovascularly intact. Clavicle fracture involves shaft of bone and is without significant angulation. No skin tenting. Normal neurosensory. Distal pulses are present.   Will refer to Dr. Susa Simmonds for further management in orthopedic setting. Pain management provided. He is stable  for discharge.   Final Clinical Impressions(s) / ED Diagnoses   Final diagnoses:  None    ED Discharge Orders    None       Elpidio Anis, PA-C 11/17/18 0459    Gilda Crease, MD 11/18/18 [phone]

## 2018-11-16 NOTE — Progress Notes (Signed)
Orthopedic Tech Progress Note Patient Details:  Antonio Mcfarland 04-Apr-1991 [id number]  Ortho Devices Type of Ortho Device: Ulna gutter splint Ortho Device/Splint Interventions: Application, Ordered   Post Interventions Patient Tolerated: Well Instructions Provided: Care of device, Adjustment of device   Norva Karvonen T 11/16/2018, 4:56 AM

## 2018-11-16 NOTE — ED Triage Notes (Signed)
Patient states that he has been drinking tonight and fell on concrete. C/o left clavicle pain and right hand pain. Denies LOC.

## 2020-09-05 IMAGING — DX DG SHOULDER 2+V*L*
1 series · 1 of 1 positions shown · non-contrast
Comparison: None.

CLINICAL DATA: Fall

EXAM:
LEFT SHOULDER - 2+ VIEW

[shoulder]
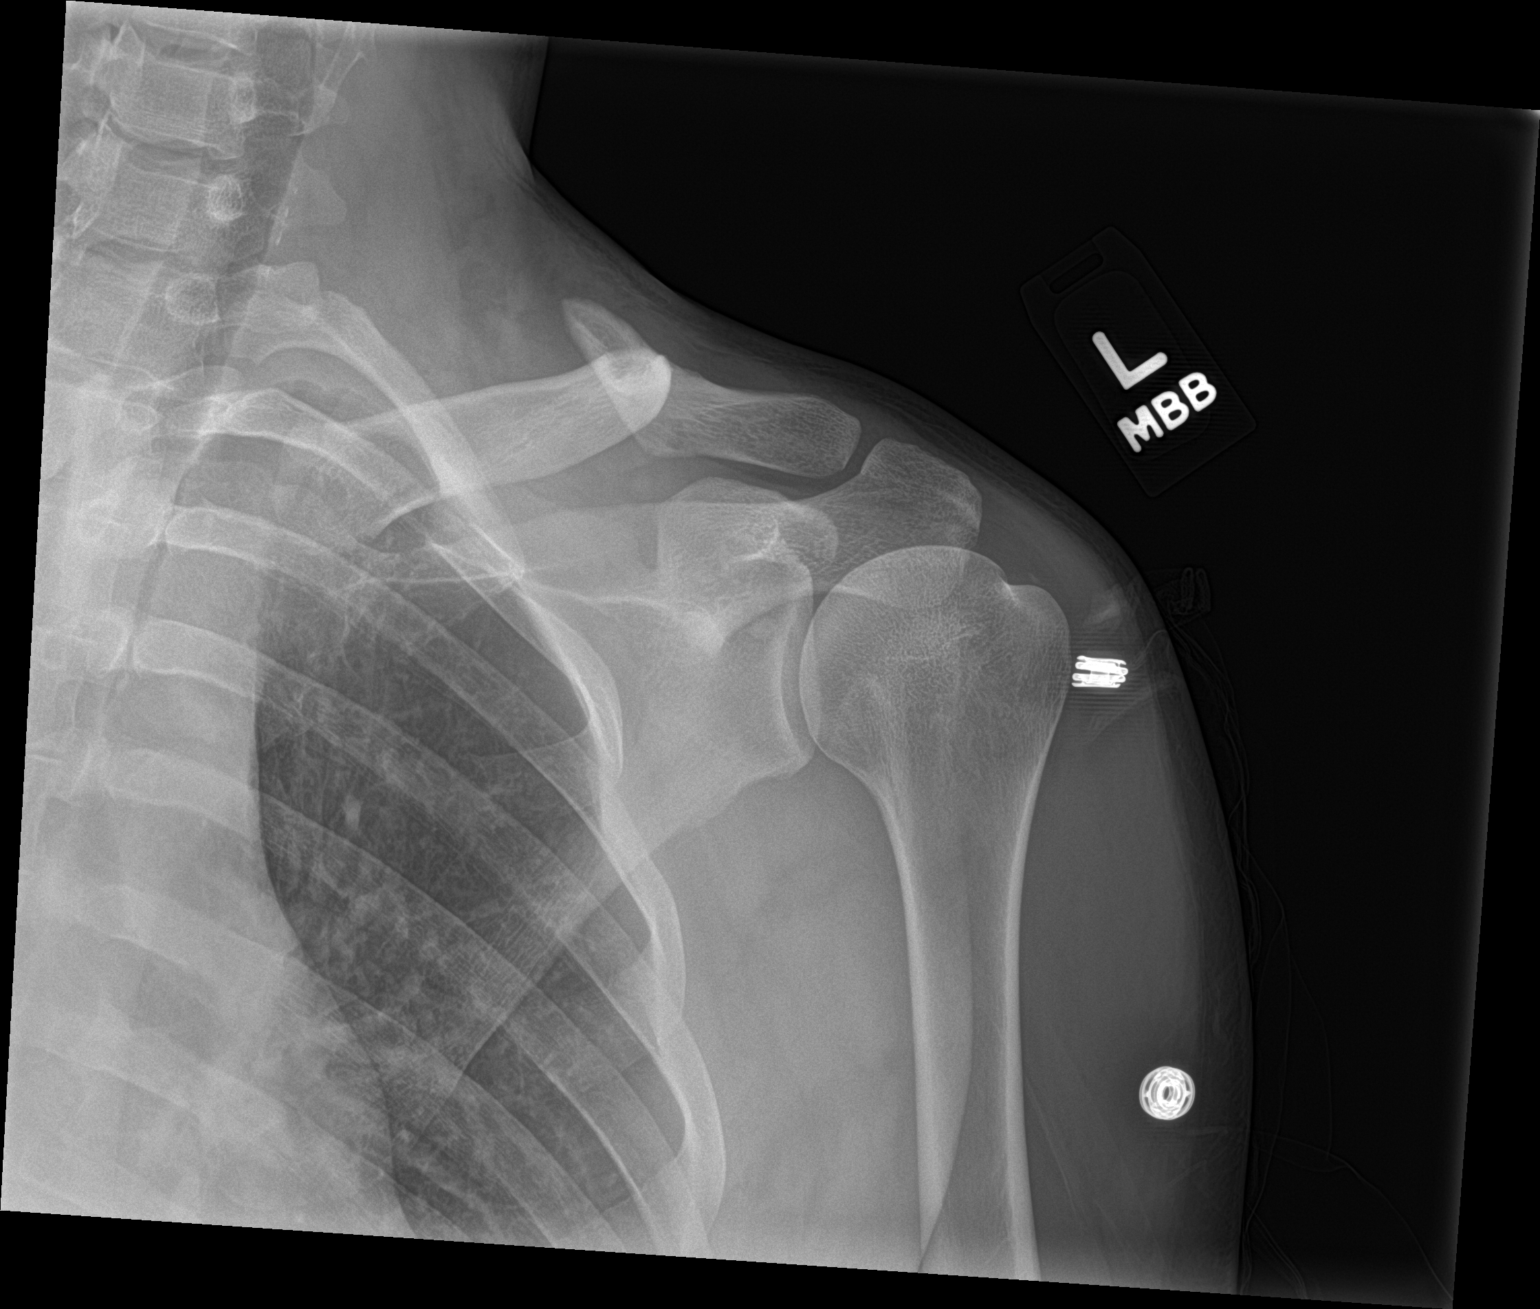

[1 of 1 positions shown; findings below may reference images not displayed]

FINDINGS: Acute angulated fracture mid to distal shaft left clavicle with
displacement and apex superior angulation.
IMPRESSION: Acute angulated and displaced fracture mid to distal left clavicle

## 2021-12-06 ENCOUNTER — Other Ambulatory Visit: Payer: Self-pay

## 2021-12-06 ENCOUNTER — Encounter (HOSPITAL_COMMUNITY): Payer: Self-pay | Admitting: *Deleted

## 2021-12-06 ENCOUNTER — Ambulatory Visit (HOSPITAL_COMMUNITY)
Admission: EM | Admit: 2021-12-06 | Discharge: 2021-12-06 | Disposition: A | Discharge status: Home / Self Care | Payer: Self-pay | Attending: Physician Assistant | Admitting: Physician Assistant

## 2021-12-06 DIAGNOSIS — Z113 Encounter for screening for infections with a predominantly sexual mode of transmission: Secondary | ICD-10-CM

## 2021-12-06 DIAGNOSIS — N481 Balanitis: Secondary | ICD-10-CM

## 2021-12-06 LAB — POCT URINALYSIS DIPSTICK, ED / UC
Bilirubin Urine: NEGATIVE
Glucose, UA: NEGATIVE mg/dL
Hgb urine dipstick: NEGATIVE
Ketones, ur: NEGATIVE mg/dL
Leukocytes,Ua: NEGATIVE
Nitrite: NEGATIVE
Protein, ur: NEGATIVE mg/dL
Specific Gravity, Urine: 1.02 (ref 1.005–1.030)
Urobilinogen, UA: 1 mg/dL (ref 0.0–1.0)
pH: 7.5 (ref 5.0–8.0)

## 2021-12-06 LAB — HIV ANTIBODY (ROUTINE TESTING W REFLEX): HIV Screen 4th Generation wRfx: NONREACTIVE

## 2021-12-06 LAB — HEPATITIS C ANTIBODY: HCV Ab: NONREACTIVE

## 2021-12-06 MED ORDER — HYDROCORTISONE 0.5 % EX CREA: 1.0000 "application " | TOPICAL_CREAM | Freq: Every day | CUTANEOUS | 0 refills | Status: DC

## 2021-12-06 NOTE — Discharge Instructions (Signed)
Your urine was normal in clinic today.  Please use hypoallergenic soaps and detergents.  Apply hydrocortisone cream to area of irritation.  We will contact you if any of your lab work is abnormal and we need to arrange treatment.  Please abstain from sex until you receive your results and complete treatment if required.  All partners will need to test and treat as well if you are positive for STI.  It is important you use condoms with each sexual encounter.  If he develop any additional or worsening symptoms please return for reevaluation.

## 2021-12-06 NOTE — ED Provider Notes (Signed)
[facility]    CSN: [id number] Arrival date & time: 12/06/21  1119      History   Chief Complaint Chief Complaint  Patient presents with   SEXUALLY TRANSMITTED DISEASE    HPI Antonio Mcfarland is a 31 y.o. male.   Patient presents today with a weeklong history of intermittent pruritus of his penis.  He denies any penile discharge, dysuria, urinary frequency, urinary urgency, abdominal pain, nausea, vomiting.  He is interested in STI screening but denies any specific contacts.  He does report changing his soap recently and is unsure if this could be contributing to symptoms.  He denies any recent medication use.  Denies history of diabetes for SGLT 2 inhibitor use.  He has not tried any over-the-counter medication for symptom management.   History reviewed. No pertinent past medical history.  There are no problems to display for this patient.   History reviewed. No pertinent surgical history.     Home Medications    Prior to Admission medications   Medication Sig Start Date End Date Taking? Authorizing Provider  hydrocortisone cream 0.5 % Apply 1 application topically daily. 12/06/21  Yes Maan Zarcone, Noberto RetortErin K, PA-C  acetaminophen (TYLENOL) 325 MG tablet Take 2 tablets (650 mg total) by mouth every 6 (six) hours as needed. Do not take more than 4000mg  of tylenol per day 03/05/18   Couture, Cortni S, PA-C  amphetamine-dextroamphetamine (ADDERALL) 20 MG tablet Take 40 mg by mouth once.    [provider]  HYDROcodone-acetaminophen (NORCO/VICODIN) 5-325 MG tablet Take 1-2 tablets by mouth every 6 (six) hours as needed. 03/16/18   Maxwell CaulLayden, Lindsey A, PA-C  ibuprofen (ADVIL,MOTRIN) 200 MG tablet Take 800 mg by mouth every 6 (six) hours as needed for moderate pain.    [provider]  OXYCODONE HCL PO Take 4 tablets by mouth once.    [provider]  oxyCODONE-acetaminophen (PERCOCET/ROXICET) 5-325 MG tablet Take 1 tablet by mouth every 4 (four) hours as  needed for severe pain. 11/16/18   Elpidio AnisUpstill, Shari, PA-C    Family History History reviewed. No pertinent family history.  Social History Social History   Tobacco Use   Smoking status: Every Day    Packs/day: 1.00    Types: Cigarettes  Substance Use Topics   Alcohol use: Yes    Comment: occ   Drug use: Yes    Types: Cocaine, Marijuana    Comment: opiates     Allergies   Patient has no known allergies.   Review of Systems Review of Systems  Constitutional:  Negative for activity change, appetite change, fatigue and fever.  Respiratory:  Negative for cough and shortness of breath.   Cardiovascular:  Negative for chest pain.  Gastrointestinal:  Negative for abdominal pain, diarrhea, nausea and vomiting.  Genitourinary:  Negative for dysuria, flank pain, penile discharge, penile pain, penile swelling, testicular pain and urgency.  Neurological:  Negative for dizziness, light-headedness and headaches.    Physical Exam Triage Vital Signs ED Triage Vitals  Enc Vitals Group     BP 12/06/21 1232 121/87     Pulse Rate 12/06/21 1232 66     Resp 12/06/21 1232 18     Temp 12/06/21 1232 98.2 F (36.8 C)     Temp src --      SpO2 12/06/21 1232 100 %     Weight --      Height --      Head Circumference --  Peak Flow --      Pain Score 12/06/21 1229 0     Pain Loc --      Pain Edu? --      Excl. in GC? --    No data found.  Updated Vital Signs BP 121/87    Pulse 66    Temp 98.2 F (36.8 C)    Resp 18    SpO2 100%   Visual Acuity Right Eye Distance:   Left Eye Distance:   Bilateral Distance:    Right Eye Near:   Left Eye Near:    Bilateral Near:     Physical Exam Vitals reviewed. Exam conducted with a chaperone present.  Constitutional:      General: He is awake.     Appearance: Normal appearance. He is well-developed. He is not ill-appearing.     Comments: Very pleasant male appears stated age no acute distress sitting comfortably in exam room  HENT:      Head: Normocephalic and atraumatic.     Mouth/Throat:     Pharynx: No oropharyngeal exudate, posterior oropharyngeal erythema or uvula swelling.  Cardiovascular:     Rate and Rhythm: Normal rate and regular rhythm.     Heart sounds: Normal heart sounds, S1 normal and S2 normal. No murmur heard. Pulmonary:     Effort: Pulmonary effort is normal.     Breath sounds: Normal breath sounds. No stridor. No wheezing, rhonchi or rales.     Comments: Clear to auscultation bilaterally Abdominal:     Palpations: Abdomen is soft.     Tenderness: There is no abdominal tenderness.  Genitourinary:    Penis: Normal and circumcised. No discharge, swelling or lesions.      Comments: Mild erythema noted posterior penile shaft.  No drainage or discharge from penis.  No lesions.  Magda Paganini, RN present as chaperone during exam. Neurological:     Mental Status: He is alert.  Psychiatric:        Behavior: Behavior is cooperative.     UC Treatments / Results  Labs (all labs ordered are listed, but only abnormal results are displayed) Labs Reviewed  HIV ANTIBODY (ROUTINE TESTING W REFLEX)  RPR  HEPATITIS C ANTIBODY  POCT URINALYSIS DIPSTICK, ED / UC  CYTOLOGY, (ORAL, ANAL, URETHRAL) ANCILLARY ONLY    EKG   Radiology No results found.  Procedures Procedures (including critical care time)  Medications Ordered in UC Medications - No data to display  Initial Impression / Assessment and Plan / UC Course  I have reviewed the triage vital signs and the nursing notes.  Pertinent labs & imaging results that were available during my care of the patient were reviewed by me and considered in my medical decision making (see chart for details).     UA was normal with no evidence of infection.  Exam was reassuring with no significant abnormality.  Discussed that itching could be related to recent change in soap and encouraged him to use hypoallergenic soap and detergent.  He was provided 0.5%  hydrocortisone cream to be used on affected areas as needed.  Low suspicion for STI but patient is interested in full STI panel.  Swab and blood work obtained today-results pending.  Discussed that he should abstain from sexual activity until results are obtained and he completes treatment as necessary.  Discussed that if he is positive for an STI all partners will be tested and treated as well.  Discussed the importance of safe sex practices.  Recommended  he return with any additional or worsening symptoms.  Strict return precautions given to which he expressed understanding.  Final Clinical Impressions(s) / UC Diagnoses   Final diagnoses:  Balanitis  Routine screening for STI (sexually transmitted infection)     Discharge Instructions      Your urine was normal in clinic today.  Please use hypoallergenic soaps and detergents.  Apply hydrocortisone cream to area of irritation.  We will contact you if any of your lab work is abnormal and we need to arrange treatment.  Please abstain from sex until you receive your results and complete treatment if required.  All partners will need to test and treat as well if you are positive for STI.  It is important you use condoms with each sexual encounter.  If he develop any additional or worsening symptoms please return for reevaluation.     ED Prescriptions     Medication Sig Dispense Auth. Provider   hydrocortisone cream 0.5 % Apply 1 application topically daily. 30 g Cher Franzoni, Noberto Retort, PA-C      PDMP not reviewed this encounter.   Jeani Hawking, PA-C 12/06/21 1326

## 2021-12-06 NOTE — ED Triage Notes (Signed)
Pt reports an odor to urine and itching to region.

## 2021-12-07 LAB — CYTOLOGY, (ORAL, ANAL, URETHRAL) ANCILLARY ONLY
Chlamydia: NEGATIVE
Comment: NEGATIVE
Comment: NEGATIVE
Comment: NORMAL
Neisseria Gonorrhea: NEGATIVE
Trichomonas: NEGATIVE

## 2021-12-07 LAB — RPR: RPR Ser Ql: NONREACTIVE

## 2023-02-11 ENCOUNTER — Emergency Department (HOSPITAL_COMMUNITY)
Admission: EM | Admit: 2023-02-11 | Discharge: 2023-02-11 | Disposition: A | Discharge status: Home / Self Care | Payer: Self-pay | Attending: Emergency Medicine | Admitting: Emergency Medicine

## 2023-02-11 ENCOUNTER — Emergency Department (HOSPITAL_COMMUNITY): Payer: Self-pay

## 2023-02-11 ENCOUNTER — Encounter: Payer: Self-pay | Admitting: Nurse Practitioner

## 2023-02-11 DIAGNOSIS — R1031 Right lower quadrant pain: Secondary | ICD-10-CM | POA: Insufficient documentation

## 2023-02-11 DIAGNOSIS — R1032 Left lower quadrant pain: Secondary | ICD-10-CM

## 2023-02-11 LAB — CBC
HCT: 47.9 % (ref 39.0–52.0)
Hemoglobin: 17.2 g/dL — ABNORMAL HIGH (ref 13.0–17.0)
MCH: 31.9 pg (ref 26.0–34.0)
MCHC: 35.9 g/dL (ref 30.0–36.0)
MCV: 88.7 fL (ref 80.0–100.0)
Platelets: 254 10*3/uL (ref 150–400)
RBC: 5.4 MIL/uL (ref 4.22–5.81)
RDW: 12.8 % (ref 11.5–15.5)
WBC: 9.9 10*3/uL (ref 4.0–10.5)
nRBC: 0 % (ref 0.0–0.2)

## 2023-02-11 LAB — COMPREHENSIVE METABOLIC PANEL
ALT: 25 U/L (ref 0–44)
AST: 31 U/L (ref 15–41)
Albumin: 4 g/dL (ref 3.5–5.0)
Alkaline Phosphatase: 56 U/L (ref 38–126)
Anion gap: 11 (ref 5–15)
BUN: 8 mg/dL (ref 6–20)
CO2: 24 mmol/L (ref 22–32)
Calcium: 9.1 mg/dL (ref 8.9–10.3)
Chloride: 101 mmol/L (ref 98–111)
Creatinine, Ser: 0.84 mg/dL (ref 0.61–1.24)
GFR, Estimated: >60 mL/min (ref >60)
Glucose, Bld: 92 mg/dL (ref 70–99)
Potassium: 3.7 mmol/L (ref 3.5–5.1)
Sodium: 136 mmol/L (ref 135–145)
Total Bilirubin: 1.1 mg/dL (ref 0.3–1.2)
Total Protein: 6.9 g/dL (ref 6.5–8.1)

## 2023-02-11 LAB — LIPASE, BLOOD: Lipase: 32 U/L (ref 11–51)

## 2023-02-11 LAB — URINALYSIS, ROUTINE W REFLEX MICROSCOPIC
Bilirubin Urine: NEGATIVE
Glucose, UA: NEGATIVE mg/dL
Hgb urine dipstick: NEGATIVE
Ketones, ur: NEGATIVE mg/dL
Leukocytes,Ua: NEGATIVE
Nitrite: NEGATIVE
Protein, ur: NEGATIVE mg/dL
Specific Gravity, Urine: 1.02 (ref 1.005–1.030)
pH: 5 (ref 5.0–8.0)

## 2023-02-11 MED ORDER — DICYCLOMINE HCL 20 MG PO TABS: 20.0000 mg | ORAL_TABLET | Freq: Two times a day (BID) | ORAL | 0 refills | Status: AC | PRN

## 2023-02-11 MED ORDER — DICYCLOMINE HCL 10 MG PO CAPS
10.0000 mg | ORAL_CAPSULE | Freq: Once | ORAL | Status: AC
  Administered 2023-02-11: 10 mg via ORAL
  Filled 2023-02-11: qty 1

## 2023-02-11 MED ORDER — IOHEXOL 350 MG/ML SOLN
75.0000 mL | Freq: Once | INTRAVENOUS | Status: AC | PRN
  Administered 2023-02-11: 75 mL via INTRAVENOUS

## 2023-02-11 MED ORDER — KETOROLAC TROMETHAMINE 15 MG/ML IJ SOLN
15.0000 mg | Freq: Once | INTRAMUSCULAR | Status: AC
  Administered 2023-02-11: 15 mg via INTRAVENOUS
  Filled 2023-02-11: qty 1

## 2023-02-11 NOTE — ED Triage Notes (Signed)
Patient here for evaluation of LLQ abdominal pain that started several months ago but over the last several days has become more intense and frequent. Pain is described as a pressure and is exacerbated by palpation. Patient is alert, oriented, ambulating independently with steady gait.

## 2023-02-11 NOTE — Discharge Instructions (Signed)
Please use Tylenol or ibuprofen for pain.  You may use 600 mg ibuprofen every 6 hours or 1000 mg of Tylenol every 6 hours.  You may choose to alternate between the 2.  This would be most effective.  Not to exceed 4 g of Tylenol within 24 hours.  Not to exceed 3200 mg ibuprofen 24 hours.  You can use the Bentyl in addition to the above to help with crampy abdominal pain as needed.

## 2023-02-11 NOTE — ED Provider Notes (Signed)
[place] EMERGENCY DEPARTMENT AT [facility] Provider Note   CSN: [id number] Arrival date & time: 02/11/23  1037     History  Chief Complaint  Patient presents with   Abdominal Pain    Antonio Mcfarland is a 32 y.o. male with noncontributory past medical history presents with concern for left lower quadrant abdominal pain intermittently for several months but worsened over the last several days.  Patient reports no nausea, vomiting, constipation, diarrhea.  He reports sometimes worse with sitting down or crunching abdomen.  He cannot tell me what makes it better.  He denies any previous history of intra-abdominal surgery.  He has not taken anything for pain prior to arrival.  Nuys any changes in his dietary habits.   Abdominal Pain      Home Medications Prior to Admission medications   Medication Sig Start Date End Date Taking? Authorizing Provider  dicyclomine (BENTYL) 20 MG tablet Take 1 tablet (20 mg total) by mouth 2 (two) times daily as needed for spasms. 02/11/23  Yes Merdith Adan H, PA-C  acetaminophen (TYLENOL) 325 MG tablet Take 2 tablets (650 mg total) by mouth every 6 (six) hours as needed. Do not take more than 4000mg  of tylenol per day 03/05/18   Couture, Cortni S, PA-C  amphetamine-dextroamphetamine (ADDERALL) 20 MG tablet Take 40 mg by mouth once.    [provider]  HYDROcodone-acetaminophen (NORCO/VICODIN) 5-325 MG tablet Take 1-2 tablets by mouth every 6 (six) hours as needed. 03/16/18   Maxwell Caul, PA-C  hydrocortisone cream 0.5 % Apply 1 application topically daily. 12/06/21   Raspet, Noberto Retort, PA-C  ibuprofen (ADVIL,MOTRIN) 200 MG tablet Take 800 mg by mouth every 6 (six) hours as needed for moderate pain.    [provider]  OXYCODONE HCL PO Take 4 tablets by mouth once.    [provider]  oxyCODONE-acetaminophen (PERCOCET/ROXICET) 5-325 MG tablet Take 1 tablet by mouth every 4 (four) hours as needed for severe pain.  11/16/18   Elpidio Anis, PA-C      Allergies    Patient has no known allergies.    Review of Systems   Review of Systems  Gastrointestinal:  Positive for abdominal pain.  All other systems reviewed and are negative.   Physical Exam Updated Vital Signs BP (!) 137/94 (BP Location: Right Arm)   Pulse 79   Temp (!) 97.4 F (36.3 C) (Oral)   Resp 16   SpO2 99%  Physical Exam Vitals and nursing note reviewed.  Constitutional:      General: He is not in acute distress.    Appearance: Normal appearance.  HENT:     Head: Normocephalic and atraumatic.  Eyes:     General:        Right eye: No discharge.        Left eye: No discharge.  Cardiovascular:     Rate and Rhythm: Normal rate and regular rhythm.     Heart sounds: No murmur heard.    No friction rub. No gallop.  Pulmonary:     Effort: Pulmonary effort is normal.     Breath sounds: Normal breath sounds.  Abdominal:     General: Bowel sounds are normal.     Palpations: Abdomen is soft.     Comments: Patient with focal tenderness in the left lower quadrant, no rebound, rigidity, guarding.  He has normal bowel sounds throughout.  He has no abdominal distention, ecchymosis.  Skin:    General: Skin  is warm.     Capillary Refill: Capillary refill takes less than 2 seconds.  Neurological:     Mental Status: He is alert and oriented to person, place, and time.  Psychiatric:        Mood and Affect: Mood normal.        Behavior: Behavior normal.     ED Results / Procedures / Treatments   Labs (all labs ordered are listed, but only abnormal results are displayed) Labs Reviewed  CBC - Abnormal; Notable for the following components:      Result Value   Hemoglobin 17.2 (*)    All other components within normal limits  LIPASE, BLOOD  COMPREHENSIVE METABOLIC PANEL  URINALYSIS, ROUTINE W REFLEX MICROSCOPIC    EKG None  Radiology CT ABDOMEN PELVIS W CONTRAST  Result Date: 02/11/2023 CLINICAL DATA:  Acute abdominal  pain. Left lower quadrant abdominal pain EXAM: CT ABDOMEN AND PELVIS WITH CONTRAST TECHNIQUE: Multidetector CT imaging of the abdomen and pelvis was performed using the standard protocol following bolus administration of intravenous contrast. RADIATION DOSE REDUCTION: This exam was performed according to the departmental dose-optimization program which includes automated exposure control, adjustment of the mA and/or kV according to patient size and/or use of iterative reconstruction technique. CONTRAST:  75mL OMNIPAQUE IOHEXOL 350 MG/ML SOLN COMPARISON:  None Available. FINDINGS: Lower chest: No acute abnormality. Hepatobiliary: No focal liver abnormality is seen. No gallstones, gallbladder wall thickening, or biliary dilatation. Pancreas: Unremarkable. No pancreatic ductal dilatation or surrounding inflammatory changes. Spleen: Normal in size without focal abnormality. Adrenals/Urinary Tract: Adrenal glands are unremarkable. Kidneys are normal, without renal calculi, focal lesion, or hydronephrosis. Bladder is unremarkable. Stomach/Bowel: Stomach is within normal limits. Appendix appears normal. Scattered colonic diverticulosis without evidence of acute diverticulitis. No evidence of bowel wall thickening, distention, or inflammatory changes. Vascular/Lymphatic: No significant vascular findings are present. No enlarged abdominal or pelvic lymph nodes. Reproductive: Prostate is unremarkable. Other: No abdominal wall hernia or abnormality. No abdominopelvic ascites. Musculoskeletal: No acute or significant osseous findings. IMPRESSION: 1. Scattered colonic diverticulosis without evidence of acute diverticulitis. 2. No CT evidence of acute abdominal/pelvic process. Electronically Signed   By: Larose HiresImran  Ahmed D.O.   On: 02/11/2023 14:19    Procedures Procedures    Medications Ordered in ED Medications  ketorolac (TORADOL) 15 MG/ML injection 15 mg (15 mg Intravenous Given 02/11/23 1306)  dicyclomine (BENTYL) capsule  10 mg (10 mg Oral Given 02/11/23 1308)  iohexol (OMNIPAQUE) 350 MG/ML injection 75 mL (75 mLs Intravenous Contrast Given 02/11/23 1403)    ED Course/ Medical Decision Making/ A&P                             Medical Decision Making Amount and/or Complexity of Data Reviewed Labs: ordered. Radiology: ordered.  Risk Prescription drug management.   This patient is a 32 y.o. male  who presents to the ED for concern of abdominal pain.   Differential diagnoses prior to evaluation: The emergent differential diagnosis includes, but is not limited to,  The causes of generalized abdominal pain include but are not limited to AAA, mesenteric ischemia, appendicitis, diverticulitis, DKA, gastritis, gastroenteritis, AMI, nephrolithiasis, pancreatitis, peritonitis, adrenal insufficiency,lead poisoning, iron toxicity, intestinal ischemia, constipation, UTI,SBO/LBO, splenic rupture, biliary disease, IBD, IBS, PUD, or hepatitis. This is not an exhaustive differential.   Past Medical History / Co-morbidities: Noncontributory  Physical Exam: Physical exam performed. The pertinent findings include: Patient with focal tenderness in the left  lower quadrant, no rebound, rigidity, guarding.  He has normal bowel sounds throughout.  He has no abdominal distention, ecchymosis.  Patient with mild hypertension, blood pressure 137/94, otherwise vital signs stable, he is not in any acute distress at this time.  Lab Tests/Imaging studies: I personally interpreted labs/imaging and the pertinent results include: CBC unremarkable, CMP, lipase, UA are all unremarkable.  Independently interpreted CT abdomen pelvis which shows diverticulosis without diverticulitis, no other acute localizing symptoms to explain patient's abdominal pain.  I agree with the radiologist interpretation.   Medications: I ordered medication including Bentyl, Toradol, patient reports mild relief of symptoms.  I have reviewed the patients home medicines  and have made adjustments as needed.   Disposition: After consideration of the diagnostic results and the patients response to treatment, I feel that patient with no acute localizing process, no surgical or infectious source to explain his abdominal pain.  He has no signs of developing infection or sepsis on his lab work.  I am suspicious for IBS, versus nonspecific colitis, low suspicion for chronic diverticulitis.  emergency department workup does not suggest an emergent condition requiring admission or immediate intervention beyond what has been performed at this time. The plan is: Follow-up with PCP, GI, will discharge with Bentyl, encouraged bland diet. The patient is safe for discharge and has been instructed to return immediately for worsening symptoms, change in symptoms or any other concerns.  Final Clinical Impression(s) / ED Diagnoses Final diagnoses:  LLQ abdominal pain    Rx / DC Orders ED Discharge Orders          Ordered    dicyclomine (BENTYL) 20 MG tablet  2 times daily PRN        02/11/23 1452              Kaylub Detienne, [place]on H, PA-C 02/11/23 1529    Maia Plan, MD 02/13/23 1206

## 2023-02-11 NOTE — ED Notes (Signed)
Pt transported to CT ?

## 2023-04-07 NOTE — Progress Notes (Unsigned)
04/07/2023 Antonio Mcfarland [id number] May 29, 1991   CHIEF COMPLAINT: LLQ pain  HISTORY OF PRESENT ILLNESS: Antonio Mcfarland is a 32 year old male  He presents to our office today for further evaluation regarding LLQ pain.  He presented to the ED 02/11/2023 due to having worsening LLQ pain for 2 to 3 months.  CTAP was unrevealing.  He was prescribed dicyclomine 20 mg twice daily and was discharged home.     Latest Ref Rng & Units 02/11/2023   10:49 AM 05/06/2014   12:32 AM  CBC  WBC 4.0 - 10.5 K/uL 9.9  8.6   Hemoglobin 13.0 - 17.0 g/dL 40.9  81.1   Hematocrit 39.0 - 52.0 % 47.9  54.9   Platelets 150 - 400 K/uL 254  200        Latest Ref Rng & Units 02/11/2023   10:49 AM 05/06/2014   12:32 AM  CMP  Glucose 70 - 99 mg/dL 92  914   BUN 6 - 20 mg/dL 8  16   Creatinine 7.82 - 1.24 mg/dL 9.56  2.13   Sodium 086 - 145 mmol/L 136  139   Potassium 3.5 - 5.1 mmol/L 3.7  3.7   Chloride 98 - 111 mmol/L 101  97   CO2 22 - 32 mmol/L 24  25   Calcium 8.9 - 10.3 mg/dL 9.1  9.5   Total Protein 6.5 - 8.1 g/dL 6.9  8.2   Total Bilirubin 0.3 - 1.2 mg/dL 1.1  0.5   Alkaline Phos 38 - 126 U/L 56  66   AST 15 - 41 U/L 31  30   ALT 0 - 44 U/L 25  28      CTAP with contrast 02/11/2023: FINDINGS: Lower chest: No acute abnormality.   Hepatobiliary: No focal liver abnormality is seen. No gallstones, gallbladder wall thickening, or biliary dilatation.   Pancreas: Unremarkable. No pancreatic ductal dilatation or surrounding inflammatory changes.   Spleen: Normal in size without focal abnormality.   Adrenals/Urinary Tract: Adrenal glands are unremarkable. Kidneys are normal, without renal calculi, focal lesion, or hydronephrosis. Bladder is unremarkable.   Stomach/Bowel: Stomach is within normal limits. Appendix appears normal. Scattered colonic diverticulosis without evidence of acute diverticulitis. No evidence of bowel wall thickening, distention, or inflammatory changes.    Vascular/Lymphatic: No significant vascular findings are present. No enlarged abdominal or pelvic lymph nodes.   Reproductive: Prostate is unremarkable.   Other: No abdominal wall hernia or abnormality. No abdominopelvic ascites.   Musculoskeletal: No acute or significant osseous findings.   IMPRESSION: 1. Scattered colonic diverticulosis without evidence of acute diverticulitis. 2. No CT evidence of acute abdominal/pelvic process.  No past medical history on file. No past surgical history on file. Social History:  Family History:    reports that he has been smoking cigarettes. He has been smoking an average of 1 pack per day. He does not have any smokeless tobacco history on file. He reports current alcohol use. He reports current drug use. Drugs: Cocaine and Marijuana. family history is not on file. No Known Allergies    Outpatient Encounter Medications as of 04/08/2023  Medication Sig   acetaminophen (TYLENOL) 325 MG tablet Take 2 tablets (650 mg total) by mouth every 6 (six) hours as needed. Do not take more than 4000mg  of tylenol per day   amphetamine-dextroamphetamine (ADDERALL) 20 MG tablet Take 40 mg by mouth once.   dicyclomine (BENTYL) 20 MG tablet Take 1 tablet (20 mg total) by  mouth 2 (two) times daily as needed for spasms.   HYDROcodone-acetaminophen (NORCO/VICODIN) 5-325 MG tablet Take 1-2 tablets by mouth every 6 (six) hours as needed.   hydrocortisone cream 0.5 % Apply 1 application topically daily.   ibuprofen (ADVIL,MOTRIN) 200 MG tablet Take 800 mg by mouth every 6 (six) hours as needed for moderate pain.   OXYCODONE HCL PO Take 4 tablets by mouth once.   oxyCODONE-acetaminophen (PERCOCET/ROXICET) 5-325 MG tablet Take 1 tablet by mouth every 4 (four) hours as needed for severe pain.   No facility-administered encounter medications on file as of 04/08/2023.     REVIEW OF SYSTEMS:  Gen: Denies fever, sweats or chills. No weight loss.  CV: Denies chest pain,  palpitations or edema. Resp: Denies cough, shortness of breath of hemoptysis.  GI: Denies heartburn, dysphagia, stomach or lower abdominal pain. No diarrhea or constipation.  GU : Denies urinary burning, blood in urine, increased urinary frequency or incontinence. MS: Denies joint pain, muscles aches or weakness. Derm: Denies rash, itchiness, skin lesions or unhealing ulcers. Psych: Denies depression, anxiety, memory loss or confusion. Heme: Denies bruising, easy bleeding. Neuro:  Denies headaches, dizziness or paresthesias. Endo:  Denies any problems with DM, thyroid or adrenal function.  PHYSICAL EXAM: There were no vitals taken for this visit. General: in no acute distress. Head: Normocephalic and atraumatic. Eyes:  Sclerae non-icteric, conjunctive pink. Ears: Normal auditory acuity. Mouth: Dentition intact. No ulcers or lesions.  Neck: Supple, no lymphadenopathy or thyromegaly.  Lungs: Clear bilaterally to auscultation without wheezes, crackles or rhonchi. Heart: Regular rate and rhythm. No murmur, rub or gallop appreciated.  Abdomen: Soft, nontender, nondistended. No masses. No hepatosplenomegaly. Normoactive bowel sounds x 4 quadrants.  Rectal:  Musculoskeletal: Symmetrical with no gross deformities. Skin: Warm and dry. No rash or lesions on visible extremities. Extremities: No edema. Neurological: Alert oriented x 4, no focal deficits.  Psychological:  Alert and cooperative. Normal mood and affect.  ASSESSMENT AND PLAN:    CC:  No ref. provider found

## 2023-04-08 ENCOUNTER — Encounter: Payer: Self-pay | Admitting: Nurse Practitioner

## 2023-04-08 ENCOUNTER — Ambulatory Visit (INDEPENDENT_AMBULATORY_CARE_PROVIDER_SITE_OTHER): Payer: Self-pay | Admitting: Nurse Practitioner

## 2023-04-08 VITALS — BP 116/80 | HR 68 | Ht 71.0 in | Wt 180.1 lb

## 2023-04-08 DIAGNOSIS — R1032 Left lower quadrant pain: Secondary | ICD-10-CM

## 2023-04-08 MED ORDER — HYOSCYAMINE SULFATE 0.125 MG SL SUBL: 0.1250 mg | SUBLINGUAL_TABLET | Freq: Four times a day (QID) | SUBLINGUAL | 0 refills | Status: AC | PRN

## 2023-04-08 NOTE — Patient Instructions (Addendum)
IBGuard- 1 by mouth twice daily as needed for abdominal pain  We have sent the following medications to your pharmacy for you to pick up at your convenience: Hyoscyamine  Miralax- every night as needed  Reduce alcohol & drug intake.  Reduce caffine intake.  Due to recent changes in healthcare laws, you may see the results of your imaging and laboratory studies on MyChart before your provider has had a chance to review them.  We understand that in some cases there may be results that are confusing or concerning to you. Not all laboratory results come back in the same time frame and the provider may be waiting for multiple results in order to interpret others.  Please give [place] 48 hours in order for your provider to thoroughly review all the results before contacting the office for clarification of your results.    Thank you for trusting me with your gastrointestinal care!   Alcide Evener, CRNP

## 2023-04-16 NOTE — Progress Notes (Signed)
Agree with assessment/plan. Has FU with Colleen. If still with problems, colon  Edman Circle, MD [facility] [phone]

## 2023-06-24 ENCOUNTER — Ambulatory Visit: Payer: Self-pay | Admitting: Nurse Practitioner

## 2023-06-24 NOTE — Progress Notes (Deleted)
06/24/2023 Antonio Mcfarland [id number] [birth year]   Chief Complaint:  History of Present Illness:  Antonio Mcfarland. Antonio Mcfarland is a 32 year old male with a past medical history of polysubstance abuse otherwise noncontributory. No past surgical history.  I initially saw patient in office on 04/07/2023 for further evaluation regarding LLQ pain.     Latest Ref Rng & Units 02/11/2023   10:49 AM 05/06/2014   12:32 AM  CBC  WBC 4.0 - 10.5 K/uL 9.9  8.6   Hemoglobin 13.0 - 17.0 g/dL 57.8  46.9   Hematocrit 39.0 - 52.0 % 47.9  54.9   Platelets 150 - 400 K/uL 254  200         Latest Ref Rng & Units 02/11/2023   10:49 AM 05/06/2014   12:32 AM  CMP  Glucose 70 - 99 mg/dL 92  629   BUN 6 - 20 mg/dL 8  16   Creatinine 5.28 - 1.24 mg/dL 4.13  2.44   Sodium 010 - 145 mmol/L 136  139   Potassium 3.5 - 5.1 mmol/L 3.7  3.7   Chloride 98 - 111 mmol/L 101  97   CO2 22 - 32 mmol/L 24  25   Calcium 8.9 - 10.3 mg/dL 9.1  9.5   Total Protein 6.5 - 8.1 g/dL 6.9  8.2   Total Bilirubin 0.3 - 1.2 mg/dL 1.1  0.5   Alkaline Phos 38 - 126 U/L 56  66   AST 15 - 41 U/L 31  30   ALT 0 - 44 U/L 25  28     CTAP with contrast 02/11/2023: FINDINGS: Lower chest: No acute abnormality.   Hepatobiliary: No focal liver abnormality is seen. No gallstones, gallbladder wall thickening, or biliary dilatation.   Pancreas: Unremarkable. No pancreatic ductal dilatation or surrounding inflammatory changes.   Spleen: Normal in size without focal abnormality.   Adrenals/Urinary Tract: Adrenal glands are unremarkable. Kidneys are normal, without renal calculi, focal lesion, or hydronephrosis. Bladder is unremarkable.   Stomach/Bowel: Stomach is within normal limits. Appendix appears normal. Scattered colonic diverticulosis without evidence of acute diverticulitis. No evidence of bowel wall thickening, distention, or inflammatory changes.   Vascular/Lymphatic: No significant vascular findings are present. No enlarged  abdominal or pelvic lymph nodes.   Reproductive: Prostate is unremarkable.   Other: No abdominal wall hernia or abnormality. No abdominopelvic ascites.   Musculoskeletal: No acute or significant osseous findings.   IMPRESSION: 1. Scattered colonic diverticulosis without evidence of acute diverticulitis. 2. No CT evidence of acute abdominal/pelvic process.     Current Medications, Allergies, Past Medical History, Past Surgical History, Family History and Social History were reviewed in [employer] record.   Review of Systems:   Constitutional: Negative for fever, sweats, chills or weight loss.  Respiratory: Negative for shortness of breath.   Cardiovascular: Negative for chest pain, palpitations and leg swelling.  Gastrointestinal: See HPI.  Musculoskeletal: Negative for back pain or muscle aches.  Neurological: Negative for dizziness, headaches or paresthesias.    Physical Exam: There were no vitals taken for this visit. General: in no acute distress. Head: Normocephalic and atraumatic. Eyes: No scleral icterus. Conjunctiva pink . Ears: Normal auditory acuity. Mouth: Dentition intact. No ulcers or lesions.  Lungs: Clear throughout to auscultation. Heart: Regular rate and rhythm, no murmur. Abdomen: Soft, nontender and nondistended. No masses or hepatomegaly. Normal bowel sounds x 4 quadrants.  Rectal: Deferred. Musculoskeletal: Symmetrical with no gross deformities. Extremities: No edema. Neurological:  Alert oriented x 4. No focal deficits.  Psychological: Alert and cooperative. Normal mood and affect  Assessment and Recommendations:  32 year old male with chronic LLQ pain which is progressively worsened over the past 6 months.  CTAP done in the ED showed evidence of diverticulosis without acute diverticulitis.  Hg level mildly elevated with normal WBC count.  Normal LFTs and lipase level.   Alcohol use disorder -Encouraged to reduce alcohol  intake   Marijuana/cocaine use -Encouraged reduced drug intake

## 2024-03-31 ENCOUNTER — Other Ambulatory Visit: Payer: Self-pay

## 2024-03-31 ENCOUNTER — Encounter (HOSPITAL_COMMUNITY): Payer: Self-pay

## 2024-03-31 ENCOUNTER — Ambulatory Visit (HOSPITAL_COMMUNITY)
Admission: RE | Admit: 2024-03-31 | Discharge: 2024-03-31 | Disposition: A | Discharge status: Home / Self Care | Payer: Self-pay | Source: Ambulatory Visit

## 2024-03-31 VITALS — BP 129/72 | HR 72 | Temp 97.9°F | Resp 18

## 2024-03-31 DIAGNOSIS — W57XXXA Bitten or stung by nonvenomous insect and other nonvenomous arthropods, initial encounter: Secondary | ICD-10-CM

## 2024-03-31 DIAGNOSIS — S30862A Insect bite (nonvenomous) of penis, initial encounter: Secondary | ICD-10-CM

## 2024-03-31 NOTE — ED Notes (Signed)
 At bedside for provider examination of genitalia

## 2024-03-31 NOTE — ED Provider Notes (Signed)
 [facility]    CSN: [id number] Arrival date & time: 03/31/24  1015      History   Chief Complaint Chief Complaint  Patient presents with   Appointment    10:30   Insect Bite    HPI Antonio Mcfarland is a 33 y.o. male.  Couple days ago he was sitting playing video games, felt a sudden sharp sensation on the shaft of the penis.  Locked and saw 2 bumps.  They have started to heal and improve over the last few days.  They are not painful.  He wanted to have them looked at just in case Having fever, nausea/vomiting, abdominal pain, penile discharge, dysuria, other rash  Past Medical History:  Diagnosis Date   Eczema    Substance abuse [facility])     Patient Active Problem List   Diagnosis Date Noted   LLQ pain 04/08/2023    Past Surgical History:  Procedure Laterality Date   NO PAST SURGERIES         Home Medications    Prior to Admission medications   Medication Sig Start Date End Date Taking? Authorizing Provider  dicyclomine  (BENTYL ) 20 MG tablet Take 1 tablet (20 mg total) by mouth 2 (two) times daily as needed for spasms. Patient not taking: Reported on 04/08/2023 02/11/23   Prosperi, Christian H, PA-C  hyoscyamine  (LEVSIN SL) 0.125 MG SL tablet Place 1 tablet (0.125 mg total) under the tongue every 6 (six) hours as needed. 04/08/23   Tory Freiberg, NP    Family History Family History  Problem Relation Age of Onset   Diabetes Mother    Hypertension Maternal Grandfather    Alcoholism Maternal Grandfather    Heart disease Maternal Grandfather    Kidney failure Maternal Grandfather    Hypertension Maternal Uncle     Social History Social History   Tobacco Use   Smoking status: Every Day    Current packs/day: 1.00    Types: Cigarettes   Smokeless tobacco: Never  Vaping Use   Vaping status: Former  Substance Use Topics   Alcohol use: Yes    Comment: 4-5 beers per day   Drug use: Yes    Types: Cocaine, Marijuana    Comment: opiates      Allergies   Patient has no known allergies.   Review of Systems Review of Systems  As per HPI  Physical Exam Triage Vital Signs ED Triage Vitals  Encounter Vitals Group     BP 03/31/24 1045 129/72     Systolic BP Percentile --      Diastolic BP Percentile --      Pulse Rate 03/31/24 1045 72     Resp 03/31/24 1045 18     Temp 03/31/24 1045 97.9 F (36.6 C)     Temp Source 03/31/24 1045 Oral     SpO2 03/31/24 1045 97 %     Weight --      Height --      Head Circumference --      Peak Flow --      Pain Score 03/31/24 1043 0     Pain Loc --      Pain Education --      Exclude from Growth Chart --    No data found.  Updated Vital Signs BP 129/72 (BP Location: Left Arm) Comment (BP Location): large cuff  Pulse 72   Temp 97.9 F (36.6 C) (Oral)   Resp 18   SpO2 97%  Visual Acuity Right Eye Distance:   Left Eye Distance:   Bilateral Distance:    Right Eye Near:   Left Eye Near:    Bilateral Near:     Physical Exam Vitals and nursing note reviewed. Exam conducted with a chaperone present Cherylann Corpus).  Constitutional:      General: He is not in acute distress.    Appearance: Normal appearance.  Cardiovascular:     Rate and Rhythm: Normal rate and regular rhythm.     Heart sounds: Normal heart sounds.  Pulmonary:     Effort: Pulmonary effort is normal.     Breath sounds: Normal breath sounds.  Genitourinary:    Comments: Two small insect bites on the shaft of penis. They are healing well. No erythema, drainage, ulcerations, pain, or other mass/bumps.  Neurological:     Mental Status: He is alert and oriented to person, place, and time.     UC Treatments / Results  Labs (all labs ordered are listed, but only abnormal results are displayed) Labs Reviewed - No data to display  EKG   Radiology No results found.  Procedures Procedures (including critical care time)  Medications Ordered in UC Medications - No data to display  Initial  Impression / Assessment and Plan / UC Course  I have reviewed the triage vital signs and the nursing notes.  Pertinent labs & imaging results that were available during my care of the patient were reviewed by me and considered in my medical decision making (see chart for details).  Likely insect bite. Healing well. No sign of infection, reassurance provided not HSV lesions. Can return with any concerns or new symptoms  Final Clinical Impressions(s) / UC Diagnoses   Final diagnoses:  Insect bite of penis, initial encounter     Discharge Instructions      Healing well! Keep clean with soap and water Monitor for any changes and return if needed   ED Prescriptions   None    PDMP not reviewed this encounter.   Newton Barer 03/31/24 1115

## 2024-03-31 NOTE — ED Triage Notes (Signed)
 Denies symptoms. Patient presents a story that he was bit by a spider or other bug on "privates" states this is healing .  And now is worrying if it was a spider/bug or not.

## 2024-03-31 NOTE — Discharge Instructions (Addendum)
 Healing well! Keep clean with soap and water Monitor for any changes and return if needed
